# Patient Record
Sex: Female | Born: 1960 | Race: White | Hispanic: No | Smoking: Former smoker
Health system: Southern US, Community
[De-identification: ages and names within clinical notes are randomized; demographics above are authoritative.]

## PROBLEM LIST (undated history)

## (undated) DIAGNOSIS — G2581 Restless legs syndrome: Secondary | ICD-10-CM

## (undated) DIAGNOSIS — R112 Nausea with vomiting, unspecified: Secondary | ICD-10-CM

## (undated) DIAGNOSIS — F329 Major depressive disorder, single episode, unspecified: Secondary | ICD-10-CM

## (undated) DIAGNOSIS — R519 Headache, unspecified: Secondary | ICD-10-CM

## (undated) DIAGNOSIS — R51 Headache: Secondary | ICD-10-CM

## (undated) DIAGNOSIS — I10 Essential (primary) hypertension: Secondary | ICD-10-CM

## (undated) DIAGNOSIS — F32A Depression, unspecified: Secondary | ICD-10-CM

## (undated) DIAGNOSIS — G4733 Obstructive sleep apnea (adult) (pediatric): Secondary | ICD-10-CM

## (undated) DIAGNOSIS — Z9889 Other specified postprocedural states: Secondary | ICD-10-CM

## (undated) DIAGNOSIS — F419 Anxiety disorder, unspecified: Secondary | ICD-10-CM

## (undated) DIAGNOSIS — N6019 Diffuse cystic mastopathy of unspecified breast: Secondary | ICD-10-CM

## (undated) DIAGNOSIS — M1990 Unspecified osteoarthritis, unspecified site: Secondary | ICD-10-CM

## (undated) HISTORY — DX: Essential (primary) hypertension: I10

## (undated) HISTORY — PX: OTHER SURGICAL HISTORY: SHX169

## (undated) HISTORY — DX: Obstructive sleep apnea (adult) (pediatric): G47.33

## (undated) HISTORY — DX: Diffuse cystic mastopathy of unspecified breast: N60.19

## (undated) HISTORY — PX: TOTAL ABDOMINAL HYSTERECTOMY: SHX209

## (undated) HISTORY — DX: Major depressive disorder, single episode, unspecified: F32.9

## (undated) HISTORY — DX: Restless legs syndrome: G25.81

## (undated) HISTORY — DX: Depression, unspecified: F32.A

## (undated) HISTORY — PX: SHOULDER SURGERY: SHX246

---

## 1998-06-14 ENCOUNTER — Other Ambulatory Visit: Admission: RE | Admit: 1998-06-14 | Discharge: 1998-06-14 | Payer: Self-pay | Admitting: Family Medicine

## 1999-08-27 ENCOUNTER — Other Ambulatory Visit: Admission: RE | Admit: 1999-08-27 | Discharge: 1999-08-27 | Payer: Self-pay | Admitting: Family Medicine

## 2001-03-26 ENCOUNTER — Other Ambulatory Visit: Admission: RE | Admit: 2001-03-26 | Discharge: 2001-03-26 | Payer: Self-pay | Admitting: Family Medicine

## 2003-12-06 ENCOUNTER — Ambulatory Visit: Payer: Self-pay | Admitting: Family Medicine

## 2003-12-21 ENCOUNTER — Ambulatory Visit: Payer: Self-pay | Admitting: Family Medicine

## 2004-06-26 ENCOUNTER — Ambulatory Visit: Payer: Self-pay | Admitting: Family Medicine

## 2004-08-06 ENCOUNTER — Ambulatory Visit: Payer: Self-pay | Admitting: Family Medicine

## 2004-09-19 ENCOUNTER — Ambulatory Visit: Payer: Self-pay | Admitting: Family Medicine

## 2004-09-24 ENCOUNTER — Ambulatory Visit: Payer: Self-pay | Admitting: Family Medicine

## 2004-10-24 ENCOUNTER — Ambulatory Visit: Payer: Self-pay | Admitting: Family Medicine

## 2004-11-13 ENCOUNTER — Ambulatory Visit: Payer: Self-pay | Admitting: Family Medicine

## 2005-01-15 ENCOUNTER — Encounter: Payer: Self-pay | Admitting: Pulmonary Disease

## 2005-01-16 ENCOUNTER — Ambulatory Visit: Payer: Self-pay | Admitting: Family Medicine

## 2005-03-15 ENCOUNTER — Ambulatory Visit: Payer: Self-pay | Admitting: Family Medicine

## 2005-03-22 ENCOUNTER — Encounter: Payer: Self-pay | Admitting: Pulmonary Disease

## 2009-12-22 ENCOUNTER — Ambulatory Visit: Payer: Self-pay | Admitting: Pulmonary Disease

## 2009-12-22 DIAGNOSIS — G4733 Obstructive sleep apnea (adult) (pediatric): Secondary | ICD-10-CM | POA: Insufficient documentation

## 2009-12-22 DIAGNOSIS — F329 Major depressive disorder, single episode, unspecified: Secondary | ICD-10-CM

## 2009-12-22 DIAGNOSIS — N6019 Diffuse cystic mastopathy of unspecified breast: Secondary | ICD-10-CM

## 2009-12-22 DIAGNOSIS — G2581 Restless legs syndrome: Secondary | ICD-10-CM | POA: Insufficient documentation

## 2009-12-22 DIAGNOSIS — G4730 Sleep apnea, unspecified: Secondary | ICD-10-CM | POA: Insufficient documentation

## 2009-12-22 DIAGNOSIS — F32A Depression, unspecified: Secondary | ICD-10-CM | POA: Insufficient documentation

## 2009-12-22 DIAGNOSIS — I10 Essential (primary) hypertension: Secondary | ICD-10-CM | POA: Insufficient documentation

## 2010-01-05 ENCOUNTER — Encounter: Payer: Self-pay | Admitting: Pulmonary Disease

## 2010-01-11 ENCOUNTER — Telehealth: Payer: Self-pay | Admitting: Pulmonary Disease

## 2010-02-09 ENCOUNTER — Ambulatory Visit
Admission: RE | Admit: 2010-02-09 | Discharge: 2010-02-09 | Payer: Self-pay | Source: Home / Self Care | Attending: Pulmonary Disease | Admitting: Pulmonary Disease

## 2010-03-06 NOTE — Assessment & Plan Note (Signed)
Summary: consult for osa, RLS   Visit Type:  Initial Consult Copy to:  Orson Gear MD Primary Provider/Referring Provider:  Orson Gear MD  CC:  Sleep Consult. Pt here for a new sleep doctor. pt has rls and bothers her during the night. pt currently on cpap. Marland Kitchen  History of Present Illness: The pt is a 50y/o female who I have been asked to see for management of osa.   She was diagnosed in 2006 with mild osa, with AHI of 11/hr, as well as moderate numbers of leg jerks.  She then underwent a cpap titration study, which determined her optimal cpap pressure to be 7cm.  She later had a repeat cpap titration study in 2010 which showed optimal pressure to be 12cm.  The pt currently is using cpap with nasal pillows and heated humidity.  She denies any significant mouth opening.  She does have issues with some nasal congestion, but is on astelin for this.  She believes the cpap is helping.  She goes to bed around 11pm, and arises at 5am to start her day.  She feels fairly rested upon awakening.  She feels her alertness during the day is adequate, and denies any sleepiness with watching tv or movies.  The other issue she is having is with her RLS.  She is having significant symptoms in the evening as well as kicking, and has not responded well to klonopin or dopamine agonists (per her history).  She tells me that she has had her iron panel checked, and it is "just below nl".  She has been treated with iron supplements without change.  The pt tells me her weight is down 15 pounds over the last 2 years, and her epworth today is only 4.  Preventive Screening-Counseling & Management  Alcohol-Tobacco     Smoking Status: current  Current Medications (verified): 1)  Allegra-D Allergy & Congestion 60-120 Mg Xr12h-Tab (Fexofenadine-Pseudoephedrine) .... Once Daily 2)  Zoloft 100 Mg Tabs (Sertraline Hcl) .... Once Daily 3)  Vitamin D (Ergocalciferol) 50000 Unit Caps (Ergocalciferol) .... Once A Week 4)   Lasix 20 Mg Tabs (Furosemide) .... Once Daily 5)  Clonazepam 1 Mg Tabs (Clonazepam) .... 1/2 Once Daily  Allergies (verified): 1)  ! Levaquin 2)  ! Sulfa 3)  ! Prednisone 4)  ! Benadryl 5)  ! * Immodium  Past History:  Past Medical History:  HYPERTENSION (ICD-401.9) RESTLESS LEGS SYNDROME (ICD-333.94)--iron panel ok FIBROCYSTIC BREAST DISEASE (ICD-610.1) SLEEP APNEA (ICD-780.57)--ahi 11/hr in 2006 DEPRESSION (ICD-311)    Past Surgical History: right shoulder surgery hysterectomy removed cyst on hand x 2 scar tissue removed off right foot  Family History: Reviewed history and no changes required. emphysema--father allergies:sister heart disease--mother, sister cancer--father (not sure)  Social History: Reviewed history and no changes required. 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 1 ppd. started age 26 Smoking Status:  current  Vital Signs:  Patient profile:   50 year old female Height:      64 inches Weight:      190.25 pounds BMI:     32.77 O2 Sat:      99 % on Room air Temp:     98.5 degrees F oral Pulse rate:   88 / minute BP sitting:   160 / 84  (left arm) Cuff size:   large  Vitals Entered By: Carver Fila (December 22, 2009 10:53 AM)  O2 Flow:  Room air CC: Sleep Consult. Pt here for a new sleep  doctor. pt has rls and bothers her during the night. pt currently on cpap.  Comments meds and allergies updated Phone number updated Carver Fila  December 22, 2009 10:53 AM    Physical Exam  General:  ow female in nad Eyes:  PERRLA and EOMI.   Nose:  deviated septum to right with obstruction currently left patent Mouth:  normal uvula and palate, mildly prominent tonsils, small oropharynx Neck:  no jvd, tmg, LN Lungs:  clear to auscultation Heart:  rrr, no mrg Abdomen:  soft and nontender, bs+ Extremities:  mild edema, no cyanosis  pulses intact distally Neurologic:  alert and oriented, moves all 4.   Impression &  Recommendations:  Problem # 1:  OBSTRUCTIVE SLEEP APNEA (ICD-327.23) the pt had mild osa from npsg in 2006, but her weight is up about 20 pounds from that time period.  She has abnormal upper airway anatomy, and may be a candidate for dental appliance as well.  She is having a lot of nasal congestion which interferes with using cpap.  Given the mild degree of her osa, and her nasal congestion/obstruction issues, she may benefit from an ENT evaluation.  I have discussed this with the pt, and she agrees.  She will continue on cpap for now until she sees ENT.  I have encouraged her to work aggressively on weight loss.  Problem # 2:  RESTLESS LEGS SYNDROME (ICD-333.94) the pt has symptoms c/w RLS, and has documented kicks on her studies with sleep disruption.  She feels that she has not responded well to dopamine agonists,and continues to be symptomatic on klonopin.  I am hesitant to increase her klonopin dose with her sleep apnea (can worsen).  Will try her on horizant and see how she responds.  Medications Added to Medication List This Visit: 1)  Allegra-d Allergy & Congestion 60-120 Mg Xr12h-tab (Fexofenadine-pseudoephedrine) .... Once daily 2)  Zoloft 100 Mg Tabs (Sertraline hcl) .... Once daily 3)  Vitamin D (ergocalciferol) 50000 Unit Caps (Ergocalciferol) .... Once a week 4)  Lasix 20 Mg Tabs (Furosemide) .... Once daily 5)  Clonazepam 1 Mg Tabs (Clonazepam) .... 1/2 once daily  Other Orders: Consultation Level V [phone]) ENT Referral (ENT)  Patient Instructions: 1)  trial of horizant 600mg  one after dinner each night.  Give me some feedback in few weeks how things are going. 2)  continue with same cpap machine for now, but will refer you to ENT for upper airway evaluation.  Will also get you info on dental appliance to consider.  Do some research on internet. 3)  work on weight loss. 4)  If you decide to stay on cpap for now, will get you a new machine and see you in 6mos.

## 2010-03-06 NOTE — Progress Notes (Signed)
Summary: feedback re: new meds  Phone Note Call from Patient   Caller: Patient Call For: clance Summary of Call: pt was seen by kc 11/18. is calling to leave info re: meds w/ nurse. [phone] Initial call taken by: Tivis Ringer, CNA,  January 11, 2010 4:29 PM  Follow-up for Phone Call        called and spoke with pt --she stated that she has been taking the horizant 600mg    and she stated that she has finally started to sleep but her legs are still a little jumpy but she feels this is from her coming off of the clonazepam.  she is out of this med now.  she was told to call back and give [facility] feedback on the med.  please advise. thanks Randell Loop CMA  January 11, 2010 4:48 PM   Additional Follow-up for Phone Call Additional follow up Details #1::        if she thinks this is helping, I think we should continue a longer trial and see how she does. would call in horizant 600mg  one each night...#30, 6 fills.  have her f/u with me in 4 weeks. Additional Follow-up by: Barbaraann Share MD,  January 11, 2010 5:18 PM    Additional Follow-up for Phone Call Additional follow up Details #2::    called and spoke with pt scheduled appt 1-6 at 3:45 and pt is aware to cont with the same meds per [facility] Randell Loop CMA  January 11, 2010 5:32 PM   New/Updated Medications: HORIZANT 600 MG XR24H-TAB (GABAPENTIN ENACARBIL) take one tablet by mouth at bedtime Prescriptions: HORIZANT 600 MG XR24H-TAB (GABAPENTIN ENACARBIL) take one tablet by mouth at bedtime  #30 x 6   Entered by:   Randell Loop CMA   Authorized by:   Barbaraann Share MD   Signed by:   Randell Loop CMA on 01/11/2010   Method used:   Electronically to        [facility]* (retail)       [address] [place], [place]  [address]       Ph: [phone] or [phone]       Fax: [phone]   RxID:   [id number]

## 2010-03-08 NOTE — Assessment & Plan Note (Addendum)
Summary: rov for osa and rls.   Copy to:  Orson Gear MD Primary Provider/Referring Provider:  Orson Gear MD  CC:  follow up. Pt states she uses her cpap everynight x 6-7 hrs a night. Pt states she is having no problems with her machine. Pt took herself off the horizant due to it gave her bad indigestion. Marland Kitchen  History of Present Illness: the pt comes in today for f/u of her known mild osa and RLS.  At the last visit, she was referred to ENT for consideration of surgery for her osa.  She has been seen, and has decided to proceed with the surgery in the next few weeks.  She will wear the cpap in the interim.  She was also started on horizant at the last visit for her RLS.  She did not like the way it made her feel, and gave her "indigestion".  She has discontinued all meds at this point.  Current Medications (verified): 1)  Allegra-D Allergy & Congestion 60-120 Mg Xr12h-Tab (Fexofenadine-Pseudoephedrine) .... Once Daily 2)  Zoloft 100 Mg Tabs (Sertraline Hcl) .... Once Daily 3)  Vitamin D (Ergocalciferol) 50000 Unit Caps (Ergocalciferol) .... Once A Week 4)  Lasix 20 Mg Tabs (Furosemide) .... Once Daily 5)  Astepro 0.15 % Soln (Azelastine Hcl) .Marland Kitchen.. 1 Sprays Each Nostrile At Night  Allergies (verified): 1)  ! Levaquin 2)  ! Sulfa 3)  ! Prednisone 4)  ! Benadryl 5)  ! * Immodium  Review of Systems       The patient complains of sore throat, nasal congestion/difficulty breathing through nose, and hand/feet swelling.  The patient denies shortness of breath with activity, shortness of breath at rest, productive cough, non-productive cough, coughing up blood, chest pain, irregular heartbeats, acid heartburn, indigestion, loss of appetite, weight change, abdominal pain, difficulty swallowing, tooth/dental problems, headaches, sneezing, itching, ear ache, anxiety, depression, joint stiffness or pain, rash, change in color of mucus, and fever.    Vital Signs:  Patient profile:   50 year  old female Height:      64 inches Weight:      195.25 pounds BMI:     33.64 O2 Sat:      99 % on Room air Temp:     97.6 degrees F oral Pulse rate:   83 / minute BP sitting:   122 / 80  (left arm) Cuff size:   large  Vitals Entered By: Carver Fila (February 09, 2010 3:50 PM)  O2 Flow:  Room air CC: follow up. Pt states she uses her cpap everynight x 6-7 hrs a night. Pt states she is having no problems with her machine. Pt took herself off the horizant due to it gave her bad indigestion.  Comments meds and allergies updated Phone number updated Carver Fila  February 09, 2010 3:51 PM    Physical Exam  General:  ow female in nad Nose:  no skin breakdown or pressure necrosis from cpap mask Extremities:  no edema or cyanosis  Neurologic:  alert and oriented, moves all 4.   Impression & Recommendations:  Problem # 1:  OBSTRUCTIVE SLEEP APNEA (ICD-327.23) the pt is currently wearing cpap compliantly, and has responded well.  However, she wishes to have surgery for her osa, and will get back with ENT.  She has asked me to fill out paperwork for DMV/DOT, but I will need to have a download off her machine for documentation of compliance.  I have also encouraged her  to work on weight loss.  Problem # 2:  RESTLESS LEGS SYNDROME (ICD-333.94) the pt has not responded well or has had intolerance to many meds.  I have offered to try her on a higher dose of klonopine, but she wishes to stay off all meds at this point, and see how she does.  Medications Added to Medication List This Visit: 1)  Astepro 0.15 % Soln (Azelastine hcl) .Marland Kitchen.. 1 sprays each nostrile at night  Other Orders: DME Referral (DME) Est. Patient Level III ([address])  Patient Instructions: 1)  will get download from your dme to fill out your paperwork 2)  please call me once you have your nasal surgery, so we can re-evaluate whether you still have sleep apnea 3)  work on weight loss  Appended Document: rov for osa and  rls. never received this pt's 6mos download from dme to fill out her paperwork....can we get this.  see orders from last pt visit.

## 2010-03-14 NOTE — Consult Note (Signed)
Summary: Osborn Coho MD/[facility]  Osborn Coho MD/[facility]   Imported By: Lester Northumberland 03/08/2010 10:26:22  _____________________________________________________________________  External Attachment:    Type:   Image     Comment:   External Document

## 2010-03-26 ENCOUNTER — Encounter: Payer: Self-pay | Admitting: Pulmonary Disease

## 2010-04-03 ENCOUNTER — Telehealth (INDEPENDENT_AMBULATORY_CARE_PROVIDER_SITE_OTHER): Payer: Self-pay | Admitting: *Deleted

## 2010-04-03 NOTE — Miscellaneous (Signed)
Summary: auto download report for last 6mos  Clinical Lists Changes  download shows optimal pressure of 11cm, borderline leak, and great compliance at 97% greater than or equal to 4 hrs.

## 2010-04-13 ENCOUNTER — Encounter: Payer: Self-pay | Admitting: Pulmonary Disease

## 2010-04-17 NOTE — Letter (Signed)
Summary: [facility]  [address]   [place], [place] [address]   Phone: [phone]  Fax: [phone]    04/13/2010  Darlisa Mcniel [address] [place], [place]  [address]  Dear Ms. Sedalia Muta,   We have attempted to contact you by phone several times but have been unable to reach you.  Please call our office at your earliest convenience so that we may discuss information regarding paperwork that you want Dr. Shelle Iron to fill out for you.        Sincerely,   Marcelyn Bruins, M.D.

## 2010-04-17 NOTE — Progress Notes (Signed)
Summary: DOT paperwork  LMTCBx3---send letter  Phone Note Outgoing Call Call back at [facility] Phone [phone]   Call placed by: Arman Filter LPN,  April 03, 2010 8:47 AM Call placed to: Patient Summary of Call: we have pt's DOT paperwork but the paperwork is all information that needs to be filled out by her PCP.  There is a specific form related to OSA that she needs to get for [place] and drop off for [facility] to fill out.    [id number].  Aundra Millet Yetzali Weld LPN  April 03, 2010 8:50 AM   [facility].  Aundra Millet Blonnie Maske LPN  April 05, 1476 4:57 PM   [facility].  Arman Filter LPN  April 11, 2954 10:35 AM   [facility].  I have attempted to contact pt multiple times.  Will send pt a letter to call our office to inform her of the above information.   Initial call taken by: Arman Filter LPN,  April 13, 2010 11:19 AM

## 2011-03-12 ENCOUNTER — Encounter: Payer: Self-pay | Admitting: Pulmonary Disease

## 2011-03-13 ENCOUNTER — Encounter: Payer: Self-pay | Admitting: Pulmonary Disease

## 2011-03-13 ENCOUNTER — Encounter: Payer: Self-pay | Admitting: *Deleted

## 2011-03-13 ENCOUNTER — Ambulatory Visit (INDEPENDENT_AMBULATORY_CARE_PROVIDER_SITE_OTHER): Payer: Self-pay | Admitting: Pulmonary Disease

## 2011-03-13 DIAGNOSIS — G2581 Restless legs syndrome: Secondary | ICD-10-CM

## 2011-03-13 DIAGNOSIS — G4733 Obstructive sleep apnea (adult) (pediatric): Secondary | ICD-10-CM

## 2011-03-13 MED ORDER — CLONAZEPAM 0.5 MG PO TABS: ORAL_TABLET | ORAL | Status: DC

## 2011-03-13 NOTE — Assessment & Plan Note (Signed)
The patient has been doing very well with CPAP, and feels that she sleeps well with adequate daytime alertness.  She is due for a new machine and supplies, but currently is without insurance for another month or 2.  She will contact us when that has been restarted, and we can send an order for a new device and supplies.  I have also encouraged her to work aggressively on weight loss.  She will decide about nasal surgery some time this year.

## 2011-03-13 NOTE — Assessment & Plan Note (Signed)
Will restart the patient on Klonopin at low to moderate doses only and see if she has improvement in her symptoms.

## 2011-03-13 NOTE — Progress Notes (Signed)
  Subjective:    Patient ID: Kristina Glover, female    DOB: 1960/10/22, 51 y.o.   MRN: [id number]  HPI The patient comes in today for followup of her known mild obstructive sleep apnea, and also restless leg syndrome.  She is wearing CPAP compliantly, and her download verifies this as well as showing good control of her sleep apnea.  She is having no issues with her device, but it is due for replacement.  She also needs a new mask which is breaking down.  She feels that she is sleeping well with the device, and denies any daytime sleepiness.  She denies any sleepiness with driving.  The patient currently is on no medication for her restless leg syndrome, and is beginning to have more issues with this.  She has not responded to dopamine agonists in the past, and was intolerant of gabapentin.  Klonopin has worked for her in the past but not completely resolve her symptoms.  We had been trying to avoid high doses due to its addiction potential and sedative effect.   Review of Systems  Constitutional: Negative for fever and unexpected weight change.  HENT: Positive for congestion, sore throat, rhinorrhea, sneezing, postnasal drip and sinus pressure. Negative for ear pain, nosebleeds, trouble swallowing and dental problem.   Eyes: Negative for redness and itching.  Respiratory: Negative for cough, chest tightness, shortness of breath and wheezing.   Cardiovascular: Negative for palpitations and leg swelling.  Gastrointestinal: Negative for nausea and vomiting.  Genitourinary: Negative for dysuria.  Musculoskeletal: Negative for joint swelling.  Skin: Negative for rash.  Neurological: Positive for headaches.  Hematological: Does not bruise/bleed easily.  Psychiatric/Behavioral: Positive for dysphoric mood. The patient is nervous/anxious.        Objective:   Physical Exam Overweight female in no acute distress Nose without purulent discharge noted No skin breakdown or pressure necrosis from the  CPAP mask Lower extremities with no edema, no cyanosis Alert and oriented, moves all 4 extremities.  Does not appear to be overly sleepy.       Assessment & Plan:

## 2011-03-13 NOTE — Patient Instructions (Signed)
Will start back on klonopine 0.5mg  1-2 after dinner as needed for restless leg symptoms Continue with cpap, and let us know when your insurance is restarted.  We can arrange for new machine and supplies at that time. Work on weight loss.  Would be happy to refer you to nutrition at [facility] when ready. followup with me in one year.

## 2011-03-18 ENCOUNTER — Telehealth: Payer: Self-pay | Admitting: Pulmonary Disease

## 2011-03-20 NOTE — Telephone Encounter (Signed)
The right form is not included in the paperwork.  There is a specific form for patients with sleep disorders.

## 2011-03-20 NOTE — Telephone Encounter (Signed)
Paperwork put in [facility] VIP folder for him to review.

## 2011-03-20 NOTE — Telephone Encounter (Signed)
I spoke with pt and made her aware that the form is missing a page for sleep apnea specifically. Pt is going to call the department of transportation to see if she can get that sent to her. Will sign off message

## 2011-03-22 ENCOUNTER — Telehealth: Payer: Self-pay | Admitting: Pulmonary Disease

## 2011-03-22 NOTE — Telephone Encounter (Signed)
I have received form and advised pt of this. She stated we need to mail his to the [facility] address on the cover of form. I have made a copy of the form and was mailed to the [facility]. Nothing further needed

## 2011-03-22 NOTE — Telephone Encounter (Signed)
Done and sent to triage 

## 2011-03-22 NOTE — Telephone Encounter (Signed)
I spoke with pt and she stated she spoke with the [facility] and was advised she needed to fill out on the bottom page of 5 under other impairments. I have placed this in [facility] VIP folder. Please advise Dr. Shelle Iron thanks

## 2011-04-23 ENCOUNTER — Telehealth: Payer: Self-pay | Admitting: Pulmonary Disease

## 2011-04-23 NOTE — Telephone Encounter (Signed)
I spoke with pt and she stated she needed this mailed to the [facility] that was on the front of the paperwork. I have put this in the mail and made a copy of the form. Nothing further was needed

## 2011-04-23 NOTE — Telephone Encounter (Signed)
Paperwork put in [facility] VIP folder for him to review and fill out.

## 2011-04-23 NOTE — Telephone Encounter (Signed)
Sent to triage

## 2011-04-23 NOTE — Telephone Encounter (Signed)
Mail papers to [facility] and call pt when sent.

## 2011-08-30 ENCOUNTER — Telehealth: Payer: Self-pay | Admitting: Pulmonary Disease

## 2011-08-30 NOTE — Telephone Encounter (Signed)
Pt is requesting a new cpap machine. She says hers is 47+ years old. Also needs a new DME company because her old one is no longer in business. She says her pressure setting is 12cm. She is aware KC is out of the office until Tues., 09/03/11 and we will call her back once we speak with him. Pt verbalized understanding. Pls advise.

## 2011-09-03 ENCOUNTER — Other Ambulatory Visit: Payer: Self-pay | Admitting: Pulmonary Disease

## 2011-09-03 DIAGNOSIS — G4733 Obstructive sleep apnea (adult) (pediatric): Secondary | ICD-10-CM

## 2011-09-03 NOTE — Telephone Encounter (Signed)
Please let pt know that order has been sent to [facility], and they will arrange new dme for her.

## 2011-09-04 NOTE — Telephone Encounter (Signed)
Pt aware and needed nothing further 

## 2011-09-05 ENCOUNTER — Telehealth: Payer: Self-pay | Admitting: Pulmonary Disease

## 2011-09-05 DIAGNOSIS — G4733 Obstructive sleep apnea (adult) (pediatric): Secondary | ICD-10-CM

## 2011-09-05 NOTE — Telephone Encounter (Signed)
Order sent to [facility] for new CPAP stating that current machine not working.

## 2011-10-08 ENCOUNTER — Telehealth: Payer: Self-pay | Admitting: Pulmonary Disease

## 2011-10-08 DIAGNOSIS — G4733 Obstructive sleep apnea (adult) (pediatric): Secondary | ICD-10-CM

## 2011-10-08 NOTE — Telephone Encounter (Signed)
I spoke with the pt and she states she has insurance now and wants an order sent to [facility] for new cpap machine, mask and supplies. According to Dr. Shelle Iron last OV note, instruct was as follows: Continue with cpap, and let us know when your insurance is restarted. We can arrange for new machine and supplies at that time. I will place order.Carron Curie, CMA

## 2011-11-03 ENCOUNTER — Emergency Department (HOSPITAL_COMMUNITY): Payer: BC Managed Care – PPO

## 2011-11-03 ENCOUNTER — Encounter (HOSPITAL_COMMUNITY): Payer: Self-pay | Admitting: *Deleted

## 2011-11-03 ENCOUNTER — Observation Stay (HOSPITAL_COMMUNITY)
Admission: EM | Admit: 2011-11-03 | Discharge: 2011-11-04 | Disposition: A | Discharge status: Home / Self Care | Payer: BC Managed Care – PPO | Attending: Emergency Medicine | Admitting: Emergency Medicine

## 2011-11-03 DIAGNOSIS — G2581 Restless legs syndrome: Secondary | ICD-10-CM | POA: Insufficient documentation

## 2011-11-03 DIAGNOSIS — G4733 Obstructive sleep apnea (adult) (pediatric): Secondary | ICD-10-CM | POA: Insufficient documentation

## 2011-11-03 DIAGNOSIS — F3289 Other specified depressive episodes: Secondary | ICD-10-CM | POA: Insufficient documentation

## 2011-11-03 DIAGNOSIS — R0602 Shortness of breath: Secondary | ICD-10-CM | POA: Insufficient documentation

## 2011-11-03 DIAGNOSIS — F329 Major depressive disorder, single episode, unspecified: Secondary | ICD-10-CM | POA: Insufficient documentation

## 2011-11-03 DIAGNOSIS — J329 Chronic sinusitis, unspecified: Secondary | ICD-10-CM | POA: Insufficient documentation

## 2011-11-03 DIAGNOSIS — I10 Essential (primary) hypertension: Secondary | ICD-10-CM | POA: Insufficient documentation

## 2011-11-03 DIAGNOSIS — R079 Chest pain, unspecified: Principal | ICD-10-CM | POA: Insufficient documentation

## 2011-11-03 LAB — CBC WITH DIFFERENTIAL/PLATELET
Basophils Relative: 1 % (ref 0–1)
HCT: 44.2 % (ref 36.0–46.0)
Hemoglobin: 15.4 g/dL — ABNORMAL HIGH (ref 12.0–15.0)
Lymphocytes Relative: 31 % (ref 12–46)
MCHC: 34.8 g/dL (ref 30.0–36.0)
Monocytes Absolute: 0.8 10*3/uL (ref 0.1–1.0)
Monocytes Relative: 9 % (ref 3–12)
Neutro Abs: 5.5 10*3/uL (ref 1.7–7.7)
Neutrophils Relative %: 58 % (ref 43–77)
RBC: 5.01 MIL/uL (ref 3.87–5.11)
WBC: 9.4 10*3/uL (ref 4.0–10.5)

## 2011-11-03 LAB — POCT I-STAT TROPONIN I: Troponin i, poc: 0 ng/mL (ref 0.00–0.08)

## 2011-11-03 LAB — COMPREHENSIVE METABOLIC PANEL
ALT: 22 U/L (ref 0–35)
BUN: 8 mg/dL (ref 6–23)
CO2: 23 mEq/L (ref 19–32)
Calcium: 9.7 mg/dL (ref 8.4–10.5)
Creatinine, Ser: 0.64 mg/dL (ref 0.50–1.10)
GFR calc Af Amer: >90 mL/min (ref >90)
GFR calc non Af Amer: >90 mL/min (ref >90)
Glucose, Bld: 100 mg/dL — ABNORMAL HIGH (ref 70–99)
Sodium: 140 mEq/L (ref 135–145)
Total Protein: 7.6 g/dL (ref 6.0–8.3)

## 2011-11-03 MED ORDER — MECLIZINE HCL 25 MG PO TABS
12.5000 mg | ORAL_TABLET | Freq: Once | ORAL | Status: AC
  Administered 2011-11-03: 12.5 mg via ORAL
  Filled 2011-11-03: qty 1

## 2011-11-03 NOTE — ED Notes (Signed)
IV NURSE AT BEDSIDE ATTEMPTING TO START IV ACCESS FOR CT SCAN IN AM.

## 2011-11-03 NOTE — ED Notes (Signed)
Pt. Given coke and crackers.  Dinner tray ordered

## 2011-11-03 NOTE — ED Provider Notes (Signed)
3:44 PM Assumed care of patient in the CDU from Dr. Anitra Lauth.  Patient is on Chest Pain Protocol.  Plan is for the patient to have a Coronary CT.  Patient comes in today with a chief complaint of chest pain with onset today.  Chest pain associated with SOB.  She received one NG and one ASA prior to arrival in the ED.  Family history significant for both sister and mother had a CABG in their 42's.  EKG with no acute findings.  Initial troponin is negative.  CXR with no acute findings.  Reassessed patient.  She reports that she is not having any chest pain at this time.  Patient alert and orientated x 3 Heart:  RRR Lungs:  CTAB Abdomen:  Soft and nontender Extremities:  DP pulse 2+ bilaterally, no edema  7:00 PM Reassessed patient.  Patient in no acute distress.  She denies chest pain at this time.  11:45 PM  Reassessed patient.  No acute distress.  She denies chest pain at this time. Patient alert and orientated x 3 Heart:  RRR Lungs:  CTAB Abdomen:  Soft and nontender Extremities:  DP pulse 2+ bilaterally, no edema  12:00 AM Patient signed out to Dr. Bebe Shaggy at shift change.  He assumes care of patient overnight.  Pascal Lux [place], PA-C 11/04/11 1227

## 2011-11-03 NOTE — ED Notes (Signed)
Pt ambulated to bathroom with assistance, pt refused wheelchair, refused to get vital signs or EKG prior to going to bathroom, pt ambulating without difficulty, denies pain, no distress noted.

## 2011-11-03 NOTE — ED Notes (Signed)
Received report from Bobby, RN and now resuming care of patient.  

## 2011-11-03 NOTE — ED Provider Notes (Addendum)
History     CSN: [id number]  Arrival date & time 11/03/11  1223   First MD Initiated Contact with Patient 11/03/11 1245      Chief Complaint  Patient presents with  . Chest Pain    (Consider location/radiation/quality/duration/timing/severity/associated sxs/prior treatment) HPI Comments: Patient with sinus symptoms for the last few weeks. Recently saw her Dr. on Friday and was started on amoxicillin and Tessalon Perles for persistent sinus pain, congestion and nonproductive cough  Patient is a 51 y.o. female presenting with chest pain. The history is provided by the patient.  Chest Pain The chest pain began 3 - 5 hours ago. Duration of episode(s) is 2 minutes. Chest pain occurs intermittently. The chest pain is resolved. Associated with: nothing.  started while in the shower today. At its most intense, the pain is at 3/10. The pain is currently at 0/10. The severity of the pain is mild. The quality of the pain is described as aching and pressure-like. The pain does not radiate. Primary symptoms include shortness of breath, cough and dizziness. Pertinent negatives for primary symptoms include no fever, no palpitations, no nausea and no vomiting.  She describes the dizziness as a sensation of spinning. The dizziness began yesterday. The dizziness has been gradually worsening since its onset. It is a new problem. Associated with: recently started new medication for sinus infection. Dizziness does not occur with nausea, vomiting or diaphoresis.   Pertinent negatives for associated symptoms include no diaphoresis and no near-syncope. She tried nitroglycerin (helped her elevated blood pressure) for the symptoms.  Her past medical history is significant for hypertension.  Pertinent negatives for past medical history include no CAD and no diabetes.  Her family medical history is significant for CAD in family. Family history comments: mother and sister with CABG in the early 50's     Past Medical  History  Diagnosis Date  . Hypertension   . RLS (restless legs syndrome)   . Fibrocystic breast disease   . OSA (obstructive sleep apnea)   . Depression     Past Surgical History  Procedure Date  . Shoulder surgery     right  . Total abdominal hysterectomy   . Cyst removed     on hand x 2  . Scar tissue removed     right foot    Family History  Problem Relation Age of Onset  . Emphysema Father   . Allergies Sister   . Heart disease Mother   . Heart disease Sister   . Cancer Father     unsure what kind    History  Substance Use Topics  . Smoking status: Current Every Day Smoker -- 1.0 packs/day for 35 years    Types: Cigarettes  . Smokeless tobacco: Not on file  . Alcohol Use: Not on file    OB History    Grav Para Term Preterm Abortions TAB SAB Ect Mult Living                  Review of Systems  Constitutional: Negative for fever and diaphoresis.  HENT: Positive for congestion and sinus pressure. Negative for sore throat and voice change.   Respiratory: Positive for cough and shortness of breath.   Cardiovascular: Positive for chest pain. Negative for palpitations and near-syncope.  Gastrointestinal: Negative for nausea and vomiting.  Neurological: Positive for dizziness.  All other systems reviewed and are negative.    Allergies  Diphenhydramine hcl; Levofloxacin; Prednisone; and Sulfonamide derivatives  Home Medications  Current Outpatient Rx  Name Route Sig Dispense Refill  . AZELASTINE HCL 137 MCG/SPRAY NA SOLN Nasal Place 1 spray into the nose daily. Use in each nostril as directed    . CLONAZEPAM 0.5 MG PO TABS  Take 1 to 2 tabs after dinner as needed. 60 tablet 2  . FEXOFENADINE-PSEUDOEPHED ER 60-120 MG PO TB12 Oral Take 1 tablet by mouth daily.    . FUROSEMIDE 20 MG PO TABS Oral Take 20 mg by mouth daily.    Marland Kitchen COZAAR PO Oral Take 1 tablet by mouth daily.    . SERTRALINE HCL 100 MG PO TABS Oral Take 100 mg by mouth daily.      BP 140/82   Pulse 89  Temp 98.6 F (37 C) (Oral)  Resp 18  Ht 5\' 4"  (1.626 m)  Wt 200 lb (90.719 kg)  BMI 34.33 kg/m2  SpO2 99%  Physical Exam  Nursing note and vitals reviewed. Constitutional: She is oriented to person, place, and time. She appears well-developed and well-nourished. No distress.  HENT:  Head: Normocephalic and atraumatic.  Right Ear: Tympanic membrane and ear canal normal.  Left Ear: Tympanic membrane and ear canal normal.  Nose: Mucosal edema and rhinorrhea present.  Mouth/Throat: Oropharynx is clear and moist.  Eyes: Conjunctivae normal and EOM are normal. Pupils are equal, round, and reactive to light.  Neck: Normal range of motion. Neck supple.  Cardiovascular: Normal rate, regular rhythm and intact distal pulses.   No murmur heard. Pulmonary/Chest: Effort normal and breath sounds normal. No respiratory distress. She has no wheezes. She has no rales. She exhibits no tenderness.  Abdominal: Soft. She exhibits no distension. There is no tenderness. There is no rebound and no guarding.  Musculoskeletal: Normal range of motion. She exhibits no edema and no tenderness.  Neurological: She is alert and oriented to person, place, and time.  Skin: Skin is warm and dry. No rash noted. No erythema.  Psychiatric: She has a normal mood and affect. Her behavior is normal.    ED Course  Procedures (including critical care time)  Labs Reviewed  COMPREHENSIVE METABOLIC PANEL - Abnormal; Notable for the following:    Glucose, Bld 100 (*)     All other components within normal limits  CBC WITH DIFFERENTIAL - Abnormal; Notable for the following:    Hemoglobin 15.4 (*)     All other components within normal limits  POCT I-STAT TROPONIN I   Dg Chest 2 View  11/03/2011  *RADIOLOGY REPORT*  Clinical Data: Chest pain, shortness of breath  CHEST - 2 VIEW  Comparison: 03/20/2006  Findings: Lungs are clear. No pleural effusion or pneumothorax.  Cardiomediastinal silhouette is within normal  limits.  Visualized osseous structures are within normal limits.  IMPRESSION: No evidence of acute cardiopulmonary disease.   Original Report Authenticated By: Charline Bills, M.D.      Date: 11/03/2011  Rate: 82  Rhythm: normal sinus rhythm  QRS Axis: normal  Intervals: normal  ST/T Wave abnormalities: normal  Conduction Disutrbances: none  Narrative Interpretation: unremarkable      1. Chest pain   2. Sinusitis       MDM   Patient with vague symptoms of upper respiratory and sinus symptoms for the last several weeks and saw her doctor on Friday was started on Tessalon Perles and amoxicillin. However she states today while she was in the shower she started to feel short of breath and had intermittent chest pains. She also states she developed  a headache and felt like it was her blood pressure when she went to the fire station she had a blood pressure of 190/98 and continued intermittent chest pain. She was given aspirin and nitroglycerin which did not seem to change how often she got the episodes of chest pain but did improve the blood pressure to 140/80 on arrival here. Will patient's only risk factor for cardiac disease his hypertension but she does have a strong family history of both her sister and mother having a bypass surgery in their 82s. CBC, CMP, troponin, chest x-ray pending. EKG is within normal limits. Patient is a TIMI 0 however does have risk factors. We'll cycle enzymes and recheck. Patient is also complaining of vertigo which she feels is from her sinus disease.  She was given meclizine   2:49 PM Initial labs are within normal limits. Chest x-ray is within normal limits. Patient symptoms are improved after meclizine. Given patient is at low risk chest pain she was given the option to followup with her PCP after a 3 hour troponin versus a low risk chest pain drops protocol and she wants to stay. She was placed on the low risk chest pain protocol for coronary CT. Her  BMI is 34.  She is currently not having chest pain appeared     Gwyneth Sprout, MD 11/03/11 1449  Gwyneth Sprout, MD 11/03/11 1515  Gwyneth Sprout, MD 11/03/11 1516

## 2011-11-03 NOTE — ED Notes (Signed)
Pt in via EMS, per EMS- pt in c/o chest pain and dizziness. Pt states this am while in the shower she developed chest pain that radiated into left shoulder, with nausea, dizziness and shortness of breath. Pt BP upon arrival was 190/98, pt denies chest pain at this time, pain is intermittent, had episode in route and was given nitro SL x1, c/o continued shortness of breath. IV started PTA.  Pt alert and oriented, skin warm and dry.

## 2011-11-04 ENCOUNTER — Observation Stay (HOSPITAL_COMMUNITY): Payer: BC Managed Care – PPO

## 2011-11-04 MED ORDER — FLUTICASONE PROPIONATE 50 MCG/ACT NA SUSP: 2.0000 | Freq: Every day | NASAL | Status: DC

## 2011-11-04 MED ORDER — NITROGLYCERIN 0.4 MG SL SUBL
0.4000 mg | SUBLINGUAL_TABLET | Freq: Once | SUBLINGUAL | Status: AC
  Administered 2011-11-04: 0.4 mg via SUBLINGUAL

## 2011-11-04 MED ORDER — ASPIRIN 81 MG PO CHEW: 81.0000 mg | CHEWABLE_TABLET | Freq: Every day | ORAL | Status: DC

## 2011-11-04 MED ORDER — METOPROLOL TARTRATE 25 MG PO TABS
100.0000 mg | ORAL_TABLET | Freq: Once | ORAL | Status: AC
  Administered 2011-11-04: 100 mg via ORAL
  Filled 2011-11-04: qty 4

## 2011-11-04 MED ORDER — SIMVASTATIN 20 MG PO TABS: 20.0000 mg | ORAL_TABLET | Freq: Every evening | ORAL | Status: DC

## 2011-11-04 MED ORDER — METOPROLOL TARTRATE 1 MG/ML IV SOLN
INTRAVENOUS | Status: AC
  Filled 2011-11-04: qty 10

## 2011-11-04 MED ORDER — IOHEXOL 350 MG/ML SOLN
100.0000 mL | Freq: Once | INTRAVENOUS | Status: AC | PRN
  Administered 2011-11-04: 80 mL via INTRAVENOUS

## 2011-11-04 MED ORDER — AZITHROMYCIN 250 MG PO TABS: 250.0000 mg | ORAL_TABLET | Freq: Every day | ORAL | Status: DC

## 2011-11-04 MED ORDER — METOPROLOL TARTRATE 1 MG/ML IV SOLN
5.0000 mg | Freq: Once | INTRAVENOUS | Status: AC
  Administered 2011-11-04: 5 mg via INTRAVENOUS

## 2011-11-04 NOTE — Progress Notes (Signed)
Utilization review completed.  

## 2011-11-04 NOTE — ED Notes (Signed)
Pt arrived in CT for CTA. Spoke with Dr. Lyman Bishop, obtained verbal order for 5mg  IV metoprolol before procedure.

## 2011-11-04 NOTE — ED Notes (Signed)
Pt returned to exam room. Vital signs stable. Denies chest pain. She is resting quietly at the time. No complications from procedure. Remains on cardiac monitor.

## 2011-11-04 NOTE — ED Provider Notes (Signed)
7:45:  Protocol Metoprolol not given at 6:00 a.m. Discussed with Dr. Lyman Bishop who recommended giving 100 mg PO now and proceed with delayed study. Will reassess in one hour to determine if additional po dose required. Patient continues to be pain-free.  Lung: scattered rhonchi Cardio: RRR, no murmur Abd: soft NT  12:30- CTA resulted and shows moderate artery disease with high risk calcium score of 11.92. Discussed with Dr. Katrinka Blazing who will see patient in CDU. Patient remains pain free.  Rodena Medin, PA-C 11/04/11 1303

## 2011-11-04 NOTE — ED Notes (Signed)
Spoke with Dr. Lyman Bishop, pt to be transported for CTA x41min. Pt updated on plan of care.

## 2011-11-04 NOTE — ED Notes (Addendum)
Pt resting quietly at the time. Remains on cardiac monitor. Denies chest pain. Pt is conscious alert and oriented x4. Vital signs stable. Pt to have CTA this morning, will monitor HR x1 hour before sending for testing.

## 2011-11-04 NOTE — ED Notes (Signed)
Pt transported to CT for CTA. Vital signs stable.

## 2011-11-04 NOTE — ED Notes (Signed)
Patient is resting without distress. Reports being pain free.

## 2011-11-04 NOTE — ED Provider Notes (Signed)
Patient seen and evaluated by Dr. Katrinka Blazing in ED. He has arranged fro outpatient evaluation - nuclear stress test. Will place on simvastatin and ASA.   Rodena Medin, PA-C 11/04/11 1535

## 2011-11-04 NOTE — ED Notes (Signed)
Dr smith here to see pt 

## 2011-11-04 NOTE — ED Provider Notes (Signed)
Medical screening examination/treatment/procedure(s) were performed by non-physician practitioner and as supervising physician I was immediately available for consultation/collaboration.  Ivonne Freeburg, MD 11/04/11 1607 

## 2011-11-04 NOTE — ED Provider Notes (Signed)
Medical screening examination/treatment/procedure(s) were performed by non-physician practitioner and as supervising physician I was immediately available for consultation/collaboration.  Geoffery Lyons, MD 11/04/11 [phone]

## 2011-11-04 NOTE — Consult Note (Signed)
Admit date: 11/03/2011  Referring Physician  [facility] Primary Physician  [facility] Primary Cardiologist  HWBSmith, III, MD Reason for Consultation  chest discomfort  ASSESSMENT: 1. Dizziness, headache, and elevated blood pressure possibly related to sinus infection  2. Several month history of exertional left chest discomfort, with atypical features. Abnormal coronary CTA performed 11/04/2011 with evidence of mild to moderate obstructive disease.  3. History of depression  4. Tobacco use  PLAN: 1. Aggressive risk factor modification including smoking cessation, and initiation of statin therapy  2. Aspirin one per day  3. Stress Cardiolite study to rule out ischemia given the mildly abnormal coronary CTA. My office will call the patient arranged to do the nuclear study this week  4. Assessment and/or therapy for her headache and dizziness of uncertain cause    HPI: The patient is 51 years of age and has a family history of premature coronary atherosclerosis. Starting approximately 5 days ago she began experiencing a full feeling in her head, headache, neck sinus congestion, and dizziness. She went to the fire department and her blood pressure was elevated. She decided to come to the emergency room. Over the past several months and perhaps increasingly over the past several days to weeks she has experienced chest tightness in the left subclavicular area with radiation into the left shoulder and more apparent with physical activity. On the day of admission, she had more than of this than she had previously experienced. At rest the discomfort tends to improve. There was associated shortness of breath. Because of this history of coronary CTA was performed and revealed only mild to at most moderate nonobstructive disease. Cardiac markers negative and EKGs were nonspecific. She is currently pain free.   PMH:   Past Medical History  Diagnosis Date  . Hypertension     . RLS (restless legs syndrome)   . Fibrocystic breast disease   . OSA (obstructive sleep apnea)   . Depression      PSH:   Past Surgical History  Procedure Date  . Shoulder surgery     right  . Total abdominal hysterectomy   . Cyst removed     on hand x 2  . Scar tissue removed     right foot    Allergies:  Diphenhydramine hcl; Imodium; Levofloxacin; Prednisone; and Sulfonamide derivatives Prior to Admit Meds:   (Not in a hospital admission) Fam HX:    Family History  Problem Relation Age of Onset  . Emphysema Father   . Allergies Sister   . Heart disease Mother   . Heart disease Sister   . Cancer Father     unsure what kind   Social HX:    History   Social History  . Marital Status: Married    Spouse Name: N/A    Number of Children: N/A  . 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 Smoking status: Current Every Day Smoker -- 1.0 packs/day for 35 years    Types: Cigarettes  . Smokeless tobacco: Not on file  . Alcohol Use: Not on file  . Drug Use: Not on file  . Sexually Active: Not on file   Other Topics Concern  . Not on file   Social History Narrative  . No narrative on file     Review of Systems: Denies stroke. No history of gastrointestinal disease. She denies syncope. No palpitations.  No orthopnea or PND. Denies claudication. No transient neurological complaints  Physical Exam: Blood pressure 128/72, pulse 64, temperature 98.2 F (36.8 C), temperature source Oral, resp. rate 17, height 5\' 4"  (1.626 m), weight 90.501 kg (199 lb 8.3 oz), SpO2 97.00%. Weight change:   Moderately obese. No acute distress. Skin color is clear.  HEENT exam is unremarkable.  Neck exam reveals no jugular vein distention or carotid bruits. The lungs are clear auscultation and percussion.  Cardiac exam reveals no rub, click, gallop, or murmur.  Abdomen soft. No bruits are heard.  Extremities reveal no edema.  Pulses are 2+ and symmetric in the radials femorals and posterior tibials bilaterally. Neurological exam is unremarkable to Labs:   Lab Results  Component Value Date   WBC 9.4 11/03/2011   HGB 15.4* 11/03/2011   HCT 44.2 11/03/2011   MCV 88.2 11/03/2011   PLT 274 11/03/2011    Lab 11/03/11 1248  NA 140  K 3.6  CL 105  CO2 23  BUN 8  CREATININE 0.64  CALCIUM 9.7  PROT 7.6  BILITOT 0.3  ALKPHOS 108  ALT 22  AST 19  GLUCOSE 100*     Radiology:  Dg Chest 2 View  11/03/2011  *RADIOLOGY REPORT*  Clinical Data: Chest pain, shortness of breath  CHEST - 2 VIEW  Comparison: 03/20/2006  Findings: Lungs are clear. No pleural effusion or pneumothorax.  Cardiomediastinal silhouette is within normal limits.  Visualized osseous structures are within normal limits.  IMPRESSION: No evidence of acute cardiopulmonary disease.   Original Report Authenticated By: Charline Bills, M.D.    Ct Heart Morp W/cta Cor W/score W/ca W/cm &/or Wo/cm  11/04/2011  *RADIOLOGY REPORT*  INDICATION:  Mid and left-sided chest pain and pressure, dizziness. History of hypertension and obstructive sleep apnea.  Long-time current smoker.  CT ANGIOGRAPHY OF THE HEART, CORONARY ARTERY, STRUCTURE, AND MORPHOLOGY  CONTRAST: 80mL OMNIPAQUE IOHEXOL 350 MG/ML.  COMPARISON:  None  TECHNIQUE:  CT angiography of the coronary vessels was performed on a 256 channel system using prospective ECG gating.  A scout image and noncontrast exam (for calcium scoring) were performed. Circulation time was measured using a test bolus.  Coronary CTA was performed with sub mm slice collimation during portions of the cardiac cycle (73%, 78%, 83%) during injection of iodinated contrast.  Imaging post processing was performed on an independent workstation creating multiplanar and 3-D images, and quantitative analysis of the heart and coronary arteries.  Note that this exam targets the heart and the chest was not imaged in its entirety.  PREMEDICATION:  Lopressor 100 mg, P.O. Lopressor 5 mg, IV Nitroglycerin 0.4 mcg, sublingual.  FINDINGS: Technical quality:  Good.  Slight patient motion blurred a few of the images. Heart rate:  62  CORONARY ARTERIES: Left main coronary artery:  Normal.  Left anterior descending:  Non-calcified plaque in the proximal LAD, just before the first diagonal, causing approximately 50% diameter stenosis; this may be artifactually increased as this is the area of patient motion.  Nonobstructive calcified plaque in the mid LAD, after the first diagonal.  Left circumflex:  Nonobstructive noncalcified plaque in the proximal left circumflex causing less than 25% diameter stenosis.  Right coronary artery:  Normal.  Posterior descending artery:  Normal.  Dominance:  Right.  CORONARY CALCIUM: Total Agatston Score:  11.92 (LAD) MESA database percentile:  89  CARDIAC MEASUREMENTS: Interventricular septum (6 - 12 mm):  12 mm LV posterior wall (6 - 12 mm):  13 mm LV  diameter in diastole (35 - 52 mm):  53 mm  AORTA AND PULMONARY MEASUREMENTS: Aortic root (21 - 40 mm):             20 mm  at the annulus             24 mm  at the sinuses of Valsalva             23 mm  at the sinotubular junction Ascending aorta ( <  40 mm):  31 mm Descending aorta ( <  40 mm):  25 mm Main pulmonary artery:  ( <  30 mm):  20 mm  EXTRACARDIAC FINDINGS: Emphysematous changes in the lungs.  Visualized mediastinum unremarkable.  Bone window images demonstrate minimal thoracic spondylosis.  Visualized liver and spleen unremarkable.  IMPRESSION:  1.  Mild to moderate coronary artery disease.  The patient's total coronary artery calcium score is 11.92, which is 89 percentile for patient's matched age and gender. 2.  Noncalcified plaque in the proximal LAD causing approximately 50% diameter stenosis (though this may be artifactually increased as there is patient motion). 3.  Noncalcified plaque in the proximal left circumflex artery causing less than 25% diameter stenosis. 4.   Right coronary artery dominance. 5.  Borderline left ventricular hypertrophy. 6.  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  Report was called to [facility], a nurse in the CDU, at 1205 hours on 11/04/2011.   Original Report Authenticated By: Arnell Sieving, M.D.    EKG:  Normal sinus rhythm with no acute ST-T wave change. EKG done today reveals nonspecific T wave flattenig.    Lesleigh Noe 11/04/2011 3:02 PM

## 2011-11-04 NOTE — ED Notes (Signed)
PA HAS GIVEN PT A COKE TO DRINK WHILE AWAITING CARDIOLOGY CONSULT. WILL HOLD ON GIVING PT FOOD UNTIL CARDIOLOGY HAS FINISHED EVAL

## 2011-11-05 NOTE — ED Provider Notes (Signed)
Medical screening examination/treatment/procedure(s) were conducted as a shared visit with non-physician practitioner(s) and myself.  I personally evaluated the patient during the encounter   Lelon Ikard, MD 11/05/11 1558 

## 2011-11-21 ENCOUNTER — Telehealth: Payer: Self-pay | Admitting: Pulmonary Disease

## 2011-11-21 NOTE — Telephone Encounter (Signed)
Looked in EMR: Pt saw Dr. Jearld Fenton. I called and made pt aware of this. She needed nothing further

## 2012-03-11 ENCOUNTER — Ambulatory Visit (INDEPENDENT_AMBULATORY_CARE_PROVIDER_SITE_OTHER): Payer: BC Managed Care – PPO | Admitting: Pulmonary Disease

## 2012-03-11 ENCOUNTER — Encounter: Payer: Self-pay | Admitting: Pulmonary Disease

## 2012-03-11 VITALS — BP 138/80 | HR 78 | Temp 97.8°F | Ht 64.0 in | Wt 204.0 lb

## 2012-03-11 DIAGNOSIS — G4733 Obstructive sleep apnea (adult) (pediatric): Secondary | ICD-10-CM

## 2012-03-11 NOTE — Progress Notes (Signed)
  Subjective:    Patient ID: Kristina Glover, female    DOB: 1960-05-14, 52 y.o.   MRN: [id number]  HPI Patient comes in today for followup of her obstructive sleep apnea.  She is wearing CPAP compliantly, but is overdue for a new mask.  Her CPAP machine is also malfunctioning, and is well overdue for a new device.  Prior to the malfunctioning, the patient states she slept well and had adequate daytime alertness.  Her family today shows good compliance with adequate control of her AHI.   Review of Systems  Constitutional: Negative for fever and unexpected weight change.  HENT: Positive for congestion, sore throat, rhinorrhea, sneezing, postnasal drip and sinus pressure. Negative for ear pain, nosebleeds, trouble swallowing and dental problem.        Patient is currently on abx for sinus infection  Eyes: Negative for redness and itching.  Respiratory: Positive for cough. Negative for chest tightness, shortness of breath and wheezing.   Cardiovascular: Positive for leg swelling. Negative for palpitations.  Gastrointestinal: Negative for nausea and vomiting.  Genitourinary: Negative for dysuria.  Musculoskeletal: Positive for joint swelling and arthralgias.  Skin: Negative for rash.  Neurological: Positive for headaches.  Hematological: Does not bruise/bleed easily.  Psychiatric/Behavioral: Negative for dysphoric mood. The patient is not nervous/anxious.        Objective:   Physical Exam Obese female in no acute distress Nose without purulence or discharge noted No skin breakdown or pressure necrosis from the CPAP mask Neck without lymphadenopathy or thyromegaly Lower extremities without edema, no cyanosis Alert and oriented, moves all 4 extremities.       Assessment & Plan:

## 2012-03-11 NOTE — Assessment & Plan Note (Signed)
The patient has done very well with CPAP, but now is having an issue with her CPAP machine which is not functioning properly, and is overdue for a new mask.  I will send an order to her medical equipment company for a new device and mask, and have encouraged her to work aggressively on weight loss.  If doing well, she will followup with me in one year.

## 2012-03-11 NOTE — Patient Instructions (Addendum)
Will get you a new cpap machine and mask Will refer you to another ENT for further evaluation Work on weight loss followup with me in one year if doing well.

## 2012-03-27 ENCOUNTER — Other Ambulatory Visit: Payer: Self-pay | Admitting: Otolaryngology

## 2012-03-27 ENCOUNTER — Ambulatory Visit
Admission: RE | Admit: 2012-03-27 | Discharge: 2012-03-27 | Disposition: A | Discharge status: Home / Self Care | Payer: BC Managed Care – PPO | Source: Ambulatory Visit | Attending: Otolaryngology | Admitting: Otolaryngology

## 2012-03-27 DIAGNOSIS — J329 Chronic sinusitis, unspecified: Secondary | ICD-10-CM

## 2013-03-11 ENCOUNTER — Ambulatory Visit: Payer: BC Managed Care – PPO | Admitting: Pulmonary Disease

## 2013-03-12 ENCOUNTER — Encounter: Payer: Self-pay | Admitting: Pulmonary Disease

## 2013-05-14 DIAGNOSIS — L570 Actinic keratosis: Secondary | ICD-10-CM | POA: Insufficient documentation

## 2013-06-01 DIAGNOSIS — L820 Inflamed seborrheic keratosis: Secondary | ICD-10-CM | POA: Insufficient documentation

## 2013-08-25 ENCOUNTER — Telehealth: Payer: Self-pay | Admitting: Pulmonary Disease

## 2013-08-25 NOTE — Telephone Encounter (Signed)
Call[facility]/ pt[facility] Okay per JC. Nothing further needed

## 2013-08-26 ENCOUNTER — Ambulatory Visit: Payer: BC Managed Care – PPO | Admitting: Pulmonary Disease

## 2013-09-08 ENCOUNTER — Ambulatory Visit (INDEPENDENT_AMBULATORY_CARE_PROVIDER_SITE_OTHER): Payer: BC Managed Care – PPO | Admitting: Pulmonary Disease

## 2013-09-08 ENCOUNTER — Encounter (INDEPENDENT_AMBULATORY_CARE_PROVIDER_SITE_OTHER): Payer: Self-pay

## 2013-09-08 ENCOUNTER — Encounter: Payer: Self-pay | Admitting: Pulmonary Disease

## 2013-09-08 VITALS — BP 134/80 | HR 80 | Temp 97.8°F | Ht 64.0 in | Wt 212.8 lb

## 2013-09-08 DIAGNOSIS — G4733 Obstructive sleep apnea (adult) (pediatric): Secondary | ICD-10-CM

## 2013-09-08 NOTE — Assessment & Plan Note (Signed)
The patient is doing very well from a sleep apnea standpoint, with good compliance on her CPAP and excellent control of her AHI. I've asked her to continue working on weight loss, and to keep up with her mask changes and supplies. She is not having any issues with her sleep or daytime sleepiness at this point.

## 2013-09-08 NOTE — Progress Notes (Signed)
   Subjective:    Patient ID: Kristina Glover, female    DOB: 1960/05/06, 53 y.o.   MRN: [id number]  HPI The patient comes in today for followup of her obstructive sleep apnea. At the last visit, she was referred to otolaryngology for possible upper airway surgery for her mild OSA. She underwent tonsillectomy, and feels this helped her tremendously. Unfortunately, she stopped smoking, which led to increased weight gain. She is now working on getting her weight down, and I suspected successful, she will no longer have sleep apnea. She is having no issues with her mask fit or pressure, and her downloaded shows excellent compliance over the last one year.   Review of Systems  Constitutional: Negative for fever and unexpected weight change.  HENT: Negative for congestion, dental problem, ear pain, nosebleeds, postnasal drip, rhinorrhea, sinus pressure, sneezing, sore throat and trouble swallowing.   Eyes: Negative for redness and itching.  Respiratory: Negative for cough, chest tightness, shortness of breath and wheezing.   Cardiovascular: Negative for palpitations and leg swelling.  Gastrointestinal: Negative for nausea and vomiting.  Genitourinary: Negative for dysuria.  Musculoskeletal: Negative for joint swelling.  Skin: Negative for rash.  Neurological: Negative for headaches.  Hematological: Does not bruise/bleed easily.  Psychiatric/Behavioral: Negative for dysphoric mood. The patient is not nervous/anxious.        Objective:   Physical Exam Overweight female in no acute distress Nose without purulence or discharge noted No skin breakdown or pressure necrosis from the CPAP mask Neck without lymphadenopathy or thyromegaly Lower extremities without edema, no cyanosis Alert and oriented, moves all 4 extremities.       Assessment & Plan:

## 2013-09-08 NOTE — Patient Instructions (Signed)
Continue on cpap, and keep up with mask changes and supplies. Work on weight loss, and if you are successful, would repeat your sleep test now that you have had a tonsillectomy. followup with again in one year, but call if you are successful with your weight loss and we can discuss further.

## 2014-06-02 ENCOUNTER — Other Ambulatory Visit (HOSPITAL_COMMUNITY): Payer: Self-pay | Admitting: Family Medicine

## 2014-06-02 DIAGNOSIS — Z1231 Encounter for screening mammogram for malignant neoplasm of breast: Secondary | ICD-10-CM

## 2014-06-03 ENCOUNTER — Ambulatory Visit (HOSPITAL_COMMUNITY): Payer: Self-pay

## 2014-06-08 ENCOUNTER — Ambulatory Visit (HOSPITAL_COMMUNITY)
Admission: RE | Admit: 2014-06-08 | Discharge: 2014-06-08 | Disposition: A | Discharge status: Home / Self Care | Payer: BC Managed Care – PPO | Source: Ambulatory Visit | Attending: Family Medicine | Admitting: Family Medicine

## 2014-06-08 DIAGNOSIS — Z1231 Encounter for screening mammogram for malignant neoplasm of breast: Secondary | ICD-10-CM

## 2014-08-29 ENCOUNTER — Ambulatory Visit: Payer: BC Managed Care – PPO | Admitting: Pulmonary Disease

## 2014-08-30 ENCOUNTER — Encounter: Payer: Self-pay | Admitting: Pulmonary Disease

## 2014-08-30 ENCOUNTER — Ambulatory Visit (INDEPENDENT_AMBULATORY_CARE_PROVIDER_SITE_OTHER): Payer: BC Managed Care – PPO | Admitting: Pulmonary Disease

## 2014-08-30 VITALS — BP 170/94 | HR 78 | Ht 64.0 in | Wt 226.0 lb

## 2014-08-30 DIAGNOSIS — R0602 Shortness of breath: Secondary | ICD-10-CM

## 2014-08-30 DIAGNOSIS — G4733 Obstructive sleep apnea (adult) (pediatric): Secondary | ICD-10-CM

## 2014-08-30 DIAGNOSIS — R0600 Dyspnea, unspecified: Secondary | ICD-10-CM | POA: Diagnosis not present

## 2014-08-30 NOTE — Assessment & Plan Note (Signed)
obstructive sleep apnea is well corrected by CPAP 12 cm  Weight loss encouraged, compliance with goal of at least 4-6 hrs every night is the expectation. Advised against medications with sedative side effects Cautioned against driving when sleepy - understanding that sleepiness will vary on a day to day basis

## 2014-08-30 NOTE — Assessment & Plan Note (Signed)
no obstruction Favor deconditioning  Albuterol prn

## 2014-08-30 NOTE — Patient Instructions (Signed)
obstructive sleep apnea is well corrected by CPAP 12 cm Breathing test shows mild effect of smoking Weight loss would help

## 2014-08-30 NOTE — Progress Notes (Signed)
   Subjective:    Patient ID: Kristina Glover, female    DOB: Apr 06, 1960, 54 y.o.   MRN: [id number]  HPI  53/F , ex smoker for followup of her obstructive sleep apnea.  She underwent tonsillectomy in 2014 , and this helped her tremendously. She takes adderall for ADHD She smoked 1 PPD x 35 y Unfortunately, smoking cessation caused increased weight gain.     Chief Complaint  Patient presents with  . Sleep Apnea    Wears CPAP every night.  needs new head gear and tubing.  c/o SOB during the day   Annual FU - gained about 45 lbs after smoking cessation C/o increased dyspnea x 2 weeks, no PND, orthopnea or chest pain or edema She is having no issues with her mask fit or pressure 08/2014 35m download on 12 cm >> good usage, no residuals, no leak  NPSG 2006:  AHI 11/hr  Review of Systems neg for any significant sore throat, dysphagia, itching, sneezing, nasal congestion or excess/ purulent secretions, fever, chills, sweats, unintended wt loss, pleuritic or exertional cp, hempoptysis, orthopnea pnd or change in chronic leg swelling. Also denies presyncope, palpitations, heartburn, abdominal pain, nausea, vomiting, diarrhea or change in bowel or urinary habits, dysuria,hematuria, rash, arthralgias, visual complaints, headache, numbness weakness or ataxia.     Objective:   Physical Exam  Gen. Pleasant, obese, in no distress ENT - no lesions, no post nasal drip Neck: No JVD, no thyromegaly, no carotid bruits Lungs: no use of accessory muscles, no dullness to percussion, decreased without rales or rhonchi  Cardiovascular: Rhythm regular, heart sounds  normal, no murmurs or gallops, no peripheral edema Musculoskeletal: No deformities, no cyanosis or clubbing , no tremors       Assessment & Plan:

## 2015-05-26 ENCOUNTER — Ambulatory Visit: Payer: Self-pay | Admitting: Physician Assistant

## 2015-06-02 ENCOUNTER — Encounter (HOSPITAL_COMMUNITY): Payer: Self-pay

## 2015-06-02 ENCOUNTER — Other Ambulatory Visit: Payer: Self-pay

## 2015-06-02 ENCOUNTER — Encounter (HOSPITAL_COMMUNITY)
Admission: RE | Admit: 2015-06-02 | Discharge: 2015-06-02 | Disposition: A | Discharge status: Home / Self Care | Payer: BC Managed Care – PPO | Source: Ambulatory Visit | Attending: Orthopedic Surgery | Admitting: Orthopedic Surgery

## 2015-06-02 DIAGNOSIS — G4733 Obstructive sleep apnea (adult) (pediatric): Secondary | ICD-10-CM | POA: Insufficient documentation

## 2015-06-02 DIAGNOSIS — M50121 Cervical disc disorder at C4-C5 level with radiculopathy: Secondary | ICD-10-CM | POA: Diagnosis not present

## 2015-06-02 DIAGNOSIS — Z01818 Encounter for other preprocedural examination: Secondary | ICD-10-CM | POA: Diagnosis not present

## 2015-06-02 DIAGNOSIS — R9431 Abnormal electrocardiogram [ECG] [EKG]: Secondary | ICD-10-CM | POA: Insufficient documentation

## 2015-06-02 DIAGNOSIS — Z7982 Long term (current) use of aspirin: Secondary | ICD-10-CM | POA: Diagnosis not present

## 2015-06-02 DIAGNOSIS — Z87891 Personal history of nicotine dependence: Secondary | ICD-10-CM | POA: Diagnosis not present

## 2015-06-02 DIAGNOSIS — I10 Essential (primary) hypertension: Secondary | ICD-10-CM | POA: Insufficient documentation

## 2015-06-02 DIAGNOSIS — Z79899 Other long term (current) drug therapy: Secondary | ICD-10-CM | POA: Insufficient documentation

## 2015-06-02 DIAGNOSIS — Z01812 Encounter for preprocedural laboratory examination: Secondary | ICD-10-CM | POA: Insufficient documentation

## 2015-06-02 HISTORY — DX: Anxiety disorder, unspecified: F41.9

## 2015-06-02 HISTORY — DX: Other specified postprocedural states: Z98.890

## 2015-06-02 HISTORY — DX: Other specified postprocedural states: R11.2

## 2015-06-02 HISTORY — DX: Unspecified osteoarthritis, unspecified site: M19.90

## 2015-06-02 HISTORY — DX: Headache, unspecified: R51.9

## 2015-06-02 HISTORY — DX: Headache: R51

## 2015-06-02 LAB — CBC
HCT: 41 % (ref 36.0–46.0)
Hemoglobin: 13.2 g/dL (ref 12.0–15.0)
MCH: 29.4 pg (ref 26.0–34.0)
MCHC: 32.2 g/dL (ref 30.0–36.0)
MCV: 91.3 fL (ref 78.0–100.0)
PLATELETS: 209 10*3/uL (ref 150–400)
RBC: 4.49 MIL/uL (ref 3.87–5.11)
RDW: 13.7 % (ref 11.5–15.5)
WBC: 6.5 10*3/uL (ref 4.0–10.5)

## 2015-06-02 LAB — BASIC METABOLIC PANEL
Anion gap: 10 (ref 5–15)
BUN: 10 mg/dL (ref 6–20)
CALCIUM: 9.6 mg/dL (ref 8.9–10.3)
CO2: 24 mmol/L (ref 22–32)
CREATININE: 0.77 mg/dL (ref 0.44–1.00)
Chloride: 108 mmol/L (ref 101–111)
GFR calc Af Amer: >60 mL/min (ref >60)
GLUCOSE: 150 mg/dL — AB (ref 65–99)
POTASSIUM: 3.6 mmol/L (ref 3.5–5.1)
SODIUM: 142 mmol/L (ref 135–145)

## 2015-06-02 LAB — SURGICAL PCR SCREEN
MRSA, PCR: NEGATIVE
Staphylococcus aureus: NEGATIVE

## 2015-06-02 NOTE — Progress Notes (Signed)
ANESTHESIA TO REVIEW CHART.

## 2015-06-02 NOTE — Progress Notes (Signed)
Requested stress test, echo, ov from [facility] cardiology .[phone]

## 2015-06-02 NOTE — Pre-Procedure Instructions (Signed)
Kristina Glover  06/02/2015      [facility] - [place], [place] - [address] [address] [address] [address] [place] [id number] Phone: [phone] Fax: [phone]    Your procedure is scheduled on    Thursday  06/08/15  [facility] at 1000 A.M.  Call this number if you have problems the morning of surgery:  [phone]   Remember:  Do not eat food or drink liquids after midnight.  Take these medicines the morning of surgery with A SIP OF WATER   BUPROPION (WELLBUTRIN), METOPROLOL (TOPROL), SERTRALINE (ZOLOFT)     (STOP ASPIRIN, IBUPROFEN/ ADVIL/ MOTRIN, GOODY POWDERS, BCS, HERBAL MEDICINES)   Do not wear jewelry, make-up or nail polish.  Do not wear lotions, powders, or perfumes.  You may wear deodorant.  Do not shave 48 hours prior to surgery.  Men may shave face and neck.  Do not bring valuables to the hospital.  [facility] is not responsible for any belongings or valuables.  Contacts, dentures or bridgework may not be worn into surgery.  Leave your suitcase in the car.  After surgery it may be brought to your room.  For patients admitted to the hospital, discharge time will be determined by your treatment team.  Patients discharged the day of surgery will not be allowed to drive home.   Name and phone number of your driver:     Special instructions:  [facility] - Preparing for Surgery  Before surgery, you can play an important role.  Because skin is not sterile, your skin needs to be as free of germs as possible.  You can reduce the number of germs on you skin by washing with CHG (chlorahexidine gluconate) soap before surgery.  CHG is an antiseptic cleaner which kills germs and bonds with the skin to continue killing germs even after washing.  Please DO NOT use if you have an allergy to CHG or antibacterial soaps.  If your skin becomes reddened/irritated stop using the CHG and inform your nurse when you arrive  at Short Stay.  Do not shave (including legs and underarms) for at least 48 hours prior to the first CHG shower.  You may shave your face.  Please follow these instructions carefully:   1.  Shower with CHG Soap the night before surgery and the                                morning of Surgery.  2.  If you choose to wash your hair, wash your hair first as usual with your       normal shampoo.  3.  After you shampoo, rinse your hair and body thoroughly to remove the                      Shampoo.  4.  Use CHG as you would any other liquid soap.  You can apply chg directly       to the skin and wash gently with scrungie or a clean washcloth.  5.  Apply the CHG Soap to your body ONLY FROM THE NECK DOWN.        Do not use on open wounds or open sores.  Avoid contact with your eyes,       ears, mouth and genitals (private parts).  Wash genitals (private parts)  with your normal soap.  6.  Wash thoroughly, paying special attention to the area where your surgery        will be performed.  7.  Thoroughly rinse your body with warm water from the neck down.  8.  DO NOT shower/wash with your normal soap after using and rinsing off       the CHG Soap.  9.  Pat yourself dry with a clean towel.            10.  Wear clean pajamas.            11.  Place clean sheets on your bed the night of your first shower and do not        sleep with pets.  Day of Surgery  Do not apply any lotions/deoderants the morning of surgery.  Please wear clean clothes to the hospital/surgery center.    Please read over the following fact sheets that you were given. Pain Booklet, Coughing and Deep Breathing, MRSA Information and Surgical Site Infection Prevention

## 2015-06-05 NOTE — Progress Notes (Signed)
Anesthesia Chart Review:  Pt is a 55 year old female scheduled for C4-6 ACDF on 06/08/2015 with Dr. Shon BatonBrooks.   PMH includes:  HTN, OSA, post-op N/V. Former smoker. BMI 39.5  Medications include: adderall XR, ASA, lasix, losartan-hctz, metoprolol  Preoperative labs reviewed.    EKG 06/02/15: NSR. Nonspecific ST and T wave abnormality  Nuclear stress test 11/07/11:  - Post stress LV normal in size. Observed defect consistent with breast attenuation artifact. EF 86% - Exercise capacity 7.0 METS - Normal myocardial perfusion scan demonstrating attenuation artifact in the anterior region of the myocardium. No ischemia/scar in remaining myocardium.   If no changes, I anticipate pt can proceed with surgery as scheduled.   Rica Mastngela Lavren Lewan, FNP-BC [facility]/Anesthesiology Phone: [phone] 06/05/2015 1:45 PM

## 2015-06-07 NOTE — Anesthesia Preprocedure Evaluation (Addendum)
Anesthesia Evaluation  Patient identified by MRN, date of birth, ID band Patient awake    Reviewed: Allergy & Precautions, H&P , NPO status , Patient's Chart, lab work & pertinent test results  History of Anesthesia Complications (+) PONV and history of anesthetic complications  Airway Mallampati: II  TM Distance: >3 FB Neck ROM: full    Dental  (+) Dental Advisory Given, Caps All upper front are capped:   Pulmonary shortness of breath and with exertion, sleep apnea and Continuous Positive Airway Pressure Ventilation , former smoker,    Pulmonary exam normal breath sounds clear to auscultation       Cardiovascular Exercise Tolerance: Good hypertension, negative cardio ROS Normal cardiovascular exam Rhythm:regular Rate:Normal     Neuro/Psych negative neurological ROS  negative psych ROS   GI/Hepatic negative GI ROS, Neg liver ROS,   Endo/Other  negative endocrine ROSMorbid obesity  Renal/GU negative Renal ROS  negative genitourinary   Musculoskeletal   Abdominal (+) + obese,   Peds  Hematology negative hematology ROS (+)   Anesthesia Other Findings   Reproductive/Obstetrics negative OB ROS                           Anesthesia Physical Anesthesia Plan  ASA: III  Anesthesia Plan: General   Post-op Pain Management:    Induction: Intravenous  Airway Management Planned: Oral ETT  Additional Equipment:   Intra-op Plan:   Post-operative Plan: Extubation in OR  Informed Consent: I have reviewed the patients History and Physical, chart, labs and discussed the procedure including the risks, benefits and alternatives for the proposed anesthesia with the patient or authorized representative who has indicated his/her understanding and acceptance.   Dental Advisory Given  Plan Discussed with: CRNA  Anesthesia Plan Comments:         Anesthesia Quick Evaluation

## 2015-06-08 ENCOUNTER — Observation Stay (HOSPITAL_COMMUNITY)
Admission: RE | Admit: 2015-06-08 | Discharge: 2015-06-09 | Disposition: A | Discharge status: Home / Self Care | Payer: BC Managed Care – PPO | Source: Ambulatory Visit | Attending: Orthopedic Surgery | Admitting: Orthopedic Surgery

## 2015-06-08 ENCOUNTER — Ambulatory Visit (HOSPITAL_COMMUNITY): Payer: BC Managed Care – PPO | Admitting: Anesthesiology

## 2015-06-08 ENCOUNTER — Encounter (HOSPITAL_COMMUNITY): Payer: Self-pay | Admitting: *Deleted

## 2015-06-08 ENCOUNTER — Ambulatory Visit (HOSPITAL_COMMUNITY): Payer: BC Managed Care – PPO | Admitting: Vascular Surgery

## 2015-06-08 ENCOUNTER — Ambulatory Visit (HOSPITAL_COMMUNITY): Payer: BC Managed Care – PPO

## 2015-06-08 ENCOUNTER — Encounter (HOSPITAL_COMMUNITY)
Admission: RE | Disposition: A | Discharge status: Home / Self Care | Payer: Self-pay | Source: Ambulatory Visit | Attending: Orthopedic Surgery

## 2015-06-08 DIAGNOSIS — G4730 Sleep apnea, unspecified: Secondary | ICD-10-CM | POA: Insufficient documentation

## 2015-06-08 DIAGNOSIS — I10 Essential (primary) hypertension: Secondary | ICD-10-CM | POA: Diagnosis not present

## 2015-06-08 DIAGNOSIS — M50122 Cervical disc disorder at C5-C6 level with radiculopathy: Principal | ICD-10-CM | POA: Insufficient documentation

## 2015-06-08 DIAGNOSIS — Z419 Encounter for procedure for purposes other than remedying health state, unspecified: Secondary | ICD-10-CM

## 2015-06-08 DIAGNOSIS — M542 Cervicalgia: Secondary | ICD-10-CM | POA: Diagnosis present

## 2015-06-08 DIAGNOSIS — M4722 Other spondylosis with radiculopathy, cervical region: Secondary | ICD-10-CM | POA: Diagnosis present

## 2015-06-08 HISTORY — PX: ANTERIOR CERVICAL DECOMP/DISCECTOMY FUSION: SHX1161

## 2015-06-08 SURGERY — ANTERIOR CERVICAL DECOMPRESSION/DISCECTOMY FUSION 2 LEVELS
Anesthesia: General | Site: Spine Cervical

## 2015-06-08 MED ORDER — METHOCARBAMOL 500 MG PO TABS: 500.0000 mg | ORAL_TABLET | Freq: Three times a day (TID) | ORAL | Status: DC | PRN

## 2015-06-08 MED ORDER — ROCURONIUM BROMIDE 50 MG/5ML IV SOLN
INTRAVENOUS | Status: AC
  Filled 2015-06-08: qty 1

## 2015-06-08 MED ORDER — LOSARTAN POTASSIUM 50 MG PO TABS
100.0000 mg | ORAL_TABLET | Freq: Every day | ORAL | Status: DC
  Administered 2015-06-08: 100 mg via ORAL
  Filled 2015-06-08: qty 2

## 2015-06-08 MED ORDER — DEXAMETHASONE 4 MG PO TABS
4.0000 mg | ORAL_TABLET | Freq: Four times a day (QID) | ORAL | Status: AC
  Administered 2015-06-08 – 2015-06-09 (×3): 4 mg via ORAL
  Filled 2015-06-08 (×3): qty 1

## 2015-06-08 MED ORDER — LOSARTAN POTASSIUM-HCTZ 100-12.5 MG PO TABS: 1.0000 | ORAL_TABLET | Freq: Every day | ORAL | Status: DC

## 2015-06-08 MED ORDER — SUGAMMADEX SODIUM 200 MG/2ML IV SOLN
INTRAVENOUS | Status: DC | PRN
  Administered 2015-06-08: 300 mg via INTRAVENOUS

## 2015-06-08 MED ORDER — MIDAZOLAM HCL 5 MG/5ML IJ SOLN
INTRAMUSCULAR | Status: DC | PRN
  Administered 2015-06-08: 2 mg via INTRAVENOUS

## 2015-06-08 MED ORDER — ACETAMINOPHEN 10 MG/ML IV SOLN
INTRAVENOUS | Status: DC | PRN
  Administered 2015-06-08: 1000 mg via INTRAVENOUS

## 2015-06-08 MED ORDER — FENTANYL CITRATE (PF) 250 MCG/5ML IJ SOLN
INTRAMUSCULAR | Status: AC
  Filled 2015-06-08: qty 5

## 2015-06-08 MED ORDER — SUGAMMADEX SODIUM 500 MG/5ML IV SOLN
INTRAVENOUS | Status: AC
  Filled 2015-06-08: qty 5

## 2015-06-08 MED ORDER — PHENOL 1.4 % MT LIQD: 1.0000 | OROMUCOSAL | Status: DC | PRN

## 2015-06-08 MED ORDER — ACETAMINOPHEN 10 MG/ML IV SOLN
INTRAVENOUS | Status: AC
  Filled 2015-06-08: qty 100

## 2015-06-08 MED ORDER — METHOCARBAMOL 1000 MG/10ML IJ SOLN
500.0000 mg | Freq: Four times a day (QID) | INTRAMUSCULAR | Status: DC | PRN
  Filled 2015-06-08: qty 5

## 2015-06-08 MED ORDER — PROPOFOL 10 MG/ML IV BOLUS
INTRAVENOUS | Status: DC | PRN
  Administered 2015-06-08: 50 mg via INTRAVENOUS

## 2015-06-08 MED ORDER — SODIUM CHLORIDE 0.9% FLUSH
3.0000 mL | Freq: Two times a day (BID) | INTRAVENOUS | Status: DC
  Administered 2015-06-08: 3 mL via INTRAVENOUS

## 2015-06-08 MED ORDER — ONDANSETRON HCL 4 MG/2ML IJ SOLN
INTRAMUSCULAR | Status: DC | PRN
  Administered 2015-06-08: 4 mg via INTRAVENOUS

## 2015-06-08 MED ORDER — HYDROCHLOROTHIAZIDE 12.5 MG PO CAPS
12.5000 mg | ORAL_CAPSULE | Freq: Every day | ORAL | Status: DC
  Administered 2015-06-08: 12.5 mg via ORAL
  Filled 2015-06-08: qty 1

## 2015-06-08 MED ORDER — PROPOFOL 10 MG/ML IV BOLUS
INTRAVENOUS | Status: AC
  Filled 2015-06-08: qty 20

## 2015-06-08 MED ORDER — ROCURONIUM BROMIDE 100 MG/10ML IV SOLN
INTRAVENOUS | Status: DC | PRN
  Administered 2015-06-08: 50 mg via INTRAVENOUS

## 2015-06-08 MED ORDER — ONDANSETRON HCL 4 MG/2ML IJ SOLN: 4.0000 mg | INTRAMUSCULAR | Status: DC | PRN

## 2015-06-08 MED ORDER — DEXAMETHASONE SODIUM PHOSPHATE 4 MG/ML IJ SOLN
INTRAMUSCULAR | Status: DC | PRN
  Administered 2015-06-08: 10 mg via INTRAVENOUS

## 2015-06-08 MED ORDER — CEFAZOLIN SODIUM 1-5 GM-% IV SOLN
1.0000 g | Freq: Three times a day (TID) | INTRAVENOUS | Status: AC
  Administered 2015-06-08 (×2): 1 g via INTRAVENOUS
  Filled 2015-06-08 (×2): qty 50

## 2015-06-08 MED ORDER — METOPROLOL SUCCINATE ER 25 MG PO TB24
50.0000 mg | ORAL_TABLET | ORAL | Status: AC
  Filled 2015-06-08: qty 1

## 2015-06-08 MED ORDER — LACTATED RINGERS IV SOLN: INTRAVENOUS | Status: DC

## 2015-06-08 MED ORDER — LIDOCAINE HCL (CARDIAC) 20 MG/ML IV SOLN
INTRAVENOUS | Status: DC | PRN
  Administered 2015-06-08: 60 mg via INTRAVENOUS

## 2015-06-08 MED ORDER — FUROSEMIDE 20 MG PO TABS: 20.0000 mg | ORAL_TABLET | Freq: Every day | ORAL | Status: DC | PRN

## 2015-06-08 MED ORDER — 0.9 % SODIUM CHLORIDE (POUR BTL) OPTIME
TOPICAL | Status: DC | PRN
  Administered 2015-06-08: 1000 mL

## 2015-06-08 MED ORDER — MENTHOL 3 MG MT LOZG
1.0000 | LOZENGE | OROMUCOSAL | Status: DC | PRN
Start: 2015-06-08 — End: 2015-06-09

## 2015-06-08 MED ORDER — THROMBIN 20000 UNITS EX SOLR
CUTANEOUS | Status: AC
  Filled 2015-06-08: qty 20000

## 2015-06-08 MED ORDER — CEFAZOLIN SODIUM-DEXTROSE 2-4 GM/100ML-% IV SOLN
2.0000 g | INTRAVENOUS | Status: AC
  Administered 2015-06-08: 2 g via INTRAVENOUS
  Filled 2015-06-08: qty 100

## 2015-06-08 MED ORDER — METOPROLOL SUCCINATE ER 25 MG PO TB24
50.0000 mg | ORAL_TABLET | Freq: Every day | ORAL | Status: DC
  Filled 2015-06-08: qty 2

## 2015-06-08 MED ORDER — HEMOSTATIC AGENTS (NO CHARGE) OPTIME
TOPICAL | Status: DC | PRN
  Administered 2015-06-08: 1 via TOPICAL

## 2015-06-08 MED ORDER — SODIUM CHLORIDE 0.9% FLUSH: 3.0000 mL | INTRAVENOUS | Status: DC | PRN

## 2015-06-08 MED ORDER — METHOCARBAMOL 500 MG PO TABS
500.0000 mg | ORAL_TABLET | Freq: Four times a day (QID) | ORAL | Status: DC | PRN
  Administered 2015-06-08: 500 mg via ORAL
  Filled 2015-06-08: qty 1

## 2015-06-08 MED ORDER — SUCCINYLCHOLINE CHLORIDE 20 MG/ML IJ SOLN
INTRAMUSCULAR | Status: DC | PRN
  Administered 2015-06-08: 140 mg via INTRAVENOUS

## 2015-06-08 MED ORDER — OXYCODONE-ACETAMINOPHEN 10-325 MG PO TABS: 1.0000 | ORAL_TABLET | ORAL | Status: DC | PRN

## 2015-06-08 MED ORDER — FENTANYL CITRATE (PF) 250 MCG/5ML IJ SOLN
INTRAMUSCULAR | Status: DC | PRN
  Administered 2015-06-08 (×3): 100 ug via INTRAVENOUS
  Administered 2015-06-08 (×2): 50 ug via INTRAVENOUS
  Administered 2015-06-08: 100 ug via INTRAVENOUS

## 2015-06-08 MED ORDER — DEXAMETHASONE SODIUM PHOSPHATE 10 MG/ML IJ SOLN
INTRAMUSCULAR | Status: AC
  Filled 2015-06-08: qty 1

## 2015-06-08 MED ORDER — BUPIVACAINE-EPINEPHRINE 0.25% -1:200000 IJ SOLN
INTRAMUSCULAR | Status: DC | PRN
  Administered 2015-06-08: 6 mL

## 2015-06-08 MED ORDER — ONDANSETRON HCL 4 MG PO TABS: 4.0000 mg | ORAL_TABLET | Freq: Three times a day (TID) | ORAL | Status: DC | PRN

## 2015-06-08 MED ORDER — MORPHINE SULFATE (PF) 2 MG/ML IV SOLN: 1.0000 mg | INTRAVENOUS | Status: DC | PRN

## 2015-06-08 MED ORDER — MIDAZOLAM HCL 2 MG/2ML IJ SOLN
INTRAMUSCULAR | Status: AC
  Filled 2015-06-08: qty 2

## 2015-06-08 MED ORDER — BUPROPION HCL ER (XL) 150 MG PO TB24
150.0000 mg | ORAL_TABLET | Freq: Every day | ORAL | Status: DC
  Administered 2015-06-08: 150 mg via ORAL
  Filled 2015-06-08 (×2): qty 1

## 2015-06-08 MED ORDER — SERTRALINE HCL 50 MG PO TABS
200.0000 mg | ORAL_TABLET | Freq: Every day | ORAL | Status: DC
  Administered 2015-06-08: 200 mg via ORAL
  Filled 2015-06-08: qty 4

## 2015-06-08 MED ORDER — DEXAMETHASONE SODIUM PHOSPHATE 4 MG/ML IJ SOLN: 4.0000 mg | Freq: Four times a day (QID) | INTRAMUSCULAR | Status: AC

## 2015-06-08 MED ORDER — BUPIVACAINE-EPINEPHRINE (PF) 0.25% -1:200000 IJ SOLN
INTRAMUSCULAR | Status: AC
  Filled 2015-06-08: qty 30

## 2015-06-08 MED ORDER — THROMBIN 20000 UNITS EX SOLR
CUTANEOUS | Status: DC | PRN
  Administered 2015-06-08: 20 mL via TOPICAL

## 2015-06-08 MED ORDER — LACTATED RINGERS IV SOLN
INTRAVENOUS | Status: DC | PRN
  Administered 2015-06-08 (×2): via INTRAVENOUS

## 2015-06-08 MED ORDER — SODIUM CHLORIDE 0.9 % IV SOLN: 250.0000 mL | INTRAVENOUS | Status: DC

## 2015-06-08 MED ORDER — OXYCODONE HCL 5 MG PO TABS
5.0000 mg | ORAL_TABLET | ORAL | Status: DC | PRN
  Administered 2015-06-08 – 2015-06-09 (×3): 10 mg via ORAL
  Filled 2015-06-08 (×3): qty 2

## 2015-06-08 MED ORDER — DEXMEDETOMIDINE HCL 200 MCG/2ML IV SOLN
INTRAVENOUS | Status: DC | PRN
  Administered 2015-06-08 (×3): 12 ug via INTRAVENOUS

## 2015-06-08 SURGICAL SUPPLY — 68 items
BLADE SURG ROTATE 9660 (MISCELLANEOUS) IMPLANT
BUR EGG ELITE 4.0 (BURR) IMPLANT
BUR EGG ELITE 4.0MM (BURR)
BUR MATCHSTICK NEURO 3.0 LAGG (BURR) IMPLANT
CANISTER SUCTION 2500CC (MISCELLANEOUS) ×3 IMPLANT
CLOSURE STERI-STRIP 1/2X4 (GAUZE/BANDAGES/DRESSINGS) ×1
CLSR STERI-STRIP ANTIMIC 1/2X4 (GAUZE/BANDAGES/DRESSINGS) ×2 IMPLANT
CORDS BIPOLAR (ELECTRODE) ×3 IMPLANT
COVER SURGICAL LIGHT HANDLE (MISCELLANEOUS) ×6 IMPLANT
CRADLE DONUT ADULT HEAD (MISCELLANEOUS) ×3 IMPLANT
DECANTER SPIKE VIAL GLASS SM (MISCELLANEOUS) ×3 IMPLANT
DEVICE ENDSKLTN IMPL 16X14X7X6 (Neuro Prosthesis/Implant) ×2 IMPLANT
DRAPE C-ARM 42X72 X-RAY (DRAPES) ×3 IMPLANT
DRAPE POUCH INSTRU U-SHP 10X18 (DRAPES) ×6 IMPLANT
DRAPE SURG 17X23 STRL (DRAPES) ×3 IMPLANT
DRAPE U-SHAPE 47X51 STRL (DRAPES) ×3 IMPLANT
DRSG AQUACEL AG ADV 3.5X 6 (GAUZE/BANDAGES/DRESSINGS) ×3 IMPLANT
DRSG MEPILEX BORDER 4X4 (GAUZE/BANDAGES/DRESSINGS) ×3 IMPLANT
DURAPREP 6ML APPLICATOR 50/CS (WOUND CARE) ×3 IMPLANT
ELECT COATED BLADE 2.86 ST (ELECTRODE) ×3 IMPLANT
ELECT PENCIL ROCKER SW 15FT (MISCELLANEOUS) ×3 IMPLANT
ELECT REM PT RETURN 9FT ADLT (ELECTROSURGICAL) ×3
ELECTRODE REM PT RTRN 9FT ADLT (ELECTROSURGICAL) ×1 IMPLANT
ENDOSKELETON IMPLANT 16X14X7X6 (Neuro Prosthesis/Implant) ×6 IMPLANT
GLOVE BIO SURGEON STRL SZ 6.5 (GLOVE) ×2 IMPLANT
GLOVE BIO SURGEONS STRL SZ 6.5 (GLOVE) ×1
GLOVE BIOGEL PI IND STRL 6.5 (GLOVE) ×1 IMPLANT
GLOVE BIOGEL PI IND STRL 8.5 (GLOVE) ×1 IMPLANT
GLOVE BIOGEL PI INDICATOR 6.5 (GLOVE) ×2
GLOVE BIOGEL PI INDICATOR 8.5 (GLOVE) ×2
GLOVE SS BIOGEL STRL SZ 8.5 (GLOVE) ×1 IMPLANT
GLOVE SUPERSENSE BIOGEL SZ 8.5 (GLOVE) ×2
GOWN STRL REUS W/ TWL XL LVL3 (GOWN DISPOSABLE) ×2 IMPLANT
GOWN STRL REUS W/TWL 2XL LVL3 (GOWN DISPOSABLE) ×6 IMPLANT
GOWN STRL REUS W/TWL XL LVL3 (GOWN DISPOSABLE) ×4
KIT BASIN OR (CUSTOM PROCEDURE TRAY) ×3 IMPLANT
KIT ROOM TURNOVER OR (KITS) ×3 IMPLANT
NEEDLE SPNL 18GX3.5 QUINCKE PK (NEEDLE) ×3 IMPLANT
NS IRRIG 1000ML POUR BTL (IV SOLUTION) ×6 IMPLANT
PACK ORTHO CERVICAL (CUSTOM PROCEDURE TRAY) ×3 IMPLANT
PACK UNIVERSAL I (CUSTOM PROCEDURE TRAY) ×3 IMPLANT
PAD ARMBOARD 7.5X6 YLW CONV (MISCELLANEOUS) ×6 IMPLANT
PATTIES SURGICAL .25X.25 (GAUZE/BANDAGES/DRESSINGS) IMPLANT
PIN DISTRACTION 14 (PIN) ×6 IMPLANT
PLATE TWO LEVEL SKYLINE 30MM (Plate) ×3 IMPLANT
PUTTY BONE DBX 5CC MIX (Putty) ×3 IMPLANT
RESTRAINT LIMB HOLDER UNIV (RESTRAINTS) ×3 IMPLANT
SCREW SKYLINE 14MM SD-VA (Screw) ×18 IMPLANT
SPONGE INTESTINAL PEANUT (DISPOSABLE) ×6 IMPLANT
SPONGE LAP 4X18 X RAY DECT (DISPOSABLE) ×6 IMPLANT
SPONGE SURGIFOAM ABS GEL 100 (HEMOSTASIS) ×3 IMPLANT
SURGIFLO W/THROMBIN 8M KIT (HEMOSTASIS) ×3 IMPLANT
SUT BONE WAX W31G (SUTURE) ×3 IMPLANT
SUT MON AB 3-0 SH 27 (SUTURE) ×2
SUT MON AB 3-0 SH27 (SUTURE) ×1 IMPLANT
SUT SILK 2 0 (SUTURE) ×2
SUT SILK 2-0 18XBRD TIE 12 (SUTURE) ×1 IMPLANT
SUT STRATAFIX MNCRL+ 3-0 PS-2 (SUTURE) ×1
SUT STRATAFIX MONOCRYL 3-0 (SUTURE) ×2
SUT VIC AB 2-0 CT1 18 (SUTURE) ×3 IMPLANT
SUTURE STRATFX MNCRL+ 3-0 PS-2 (SUTURE) ×1 IMPLANT
SYR BULB IRRIGATION 50ML (SYRINGE) ×3 IMPLANT
SYR CONTROL 10ML LL (SYRINGE) ×3 IMPLANT
TAPE CLOTH 4X10 WHT NS (GAUZE/BANDAGES/DRESSINGS) ×3 IMPLANT
TAPE UMBILICAL COTTON 1/8X30 (MISCELLANEOUS) ×3 IMPLANT
TOWEL OR 17X24 6PK STRL BLUE (TOWEL DISPOSABLE) ×3 IMPLANT
TOWEL OR 17X26 10 PK STRL BLUE (TOWEL DISPOSABLE) ×3 IMPLANT
WATER STERILE IRR 1000ML POUR (IV SOLUTION) IMPLANT

## 2015-06-08 NOTE — Brief Op Note (Signed)
06/08/2015  10:46 AM  PATIENT:  Jasmine DecemberSharon A Peeters  55 y.o. 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   PROCEDURE:  Procedure(s): ANTERIOR CERVICAL DISCECTOMY FUSION C4-C5, C5-C6     (2 LEVELS)  (N/A)  SURGEON:  Surgeon(s) and Role:    * Venita Lickahari Renato Spellman, MD - Primary  PHYSICIAN ASSISTANT:   ASSISTANTS: Carmen Mayo   ANESTHESIA:   general  EBL:  Total I/O In: 1000 [I.V.:1000] Out: 50 [Blood:50]  BLOOD ADMINISTERED:none  DRAINS: none   LOCAL MEDICATIONS USED:  MARCAINE     SPECIMEN:  No Specimen  DISPOSITION OF SPECIMEN:  N/A  COUNTS:  YES  TOURNIQUET:  * No tourniquets in log *  DICTATION: .Other Dictation: Dictation Number [id number]  PLAN OF CARE: Admit for overnight observation  PATIENT DISPOSITION:  PACU - hemodynamically stable.

## 2015-06-08 NOTE — Op Note (Signed)
NAME:  Kristina Glover, Kristina Glover                  ACCOUNT NO.:  [id number]  MEDICAL RECORD NO.:  [id number]  LOCATION:  [facility]                         FACILITY:  [facility]  PHYSICIAN:  Diedre Maclellan D. Shon Baton, M.D. DATE OF BIRTH:  Jan 15, 1961  DATE OF PROCEDURE:  06/08/2015 DATE OF DISCHARGE:                              OPERATIVE REPORT   PREOPERATIVE DIAGNOSIS:  Cervical spondylotic radiculopathy, left side, C4-5 and C5-6.  POSTOPERATIVE DIAGNOSIS:  Cervical spondylotic radiculopathy, left side, C4-5 and C5-6.  OPERATIVE PROCEDURE:  Anterior cervical diskectomy and fusion, C4-5 and C5-6.  IMPLANT USED:  NanoLOCK Titan Titanium intervertebral spacer, size 7 medium at both levels, packed with DBX mix with a DePuy Skyline anterior cervical plate affixed with 14-mm screws.  FIRST ASSISTANT:  Anette Riedel, my PA.  COMPLICATIONS:  No intraoperative complications.  HISTORY:  This is a very pleasant 55 year old woman who has been having significant neck and neuropathic left arm pain.  Attempts at conservative management have failed.  Because of pain, weakness and ongoing symptoms, we elected to proceed with surgery.  All appropriate risks, benefits, and alternatives were discussed with the patient and consent was obtained.  OPERATIVE NOTE:  The patient was brought to the operating room, placed supine on the operating table.  After successful induction of general anesthesia and endotracheal intubation, TEDs and SCDs were applied.  A blood pressure cuff was placed underneath the scapula to restore lordosis.  IV pressure bag was placed underneath the shoulder blades and the anterior cervical spine was prepped and draped.  Pressure bag was inflated to restore normal lordosis.  Time-out was taken to confirm patient, procedure, and all other pertinent important data.  Lateral fluoro view was used to confirm the C5-6 level and this area was infiltrated with 0.25% Marcaine.  The midline incision was made  starting from the midline proceeding to the left.  Sharp dissection was carried out down to the platysma.  The platysma was sharply incised and I performed a standard Smith-Robinson approach to the anterior cervical spine.  I sharply dissected through the deep cervical fascia along the medial border of the sternocleidomastoid and mobilized the omohyoid muscle.  This was released from its sling, but I did not see the need to sacrifice it.  I then mobilized the prevertebral fascia and exposed the anterior cervical spine.  Self-retaining retractors were placed and the esophagus was protected on the right and the carotid sheath was protected on the left.  A needle was placed into the disk space.  An intraoperative x-ray was taken to confirm that I was at the C4-5 level. Once this was confirmed, I used bipolar electrocautery to mobilize the longus colli muscles from the midbody of C4 to the midbody of C6.  Once this was done bilaterally, I then placed the Caspar retracting blades underneath the longus colli muscle, deflated the endotracheal cuff, expanded the retractor blade system and reinflated the cuff.  I had excellent visualization of the 5-6 disk space.  An annulotomy was created using a 15-blade scalpel and then using pituitary rongeurs, curettes and Kerrison rongeurs, I removed the bulk of the disk material. I then used a 2 and 3-mm Kerrison punch  to trim down the anterior osteophyte from the inferior aspect of the C5 vertebral body.  This provided excellent visualization.  Distraction pins were placed into the 5 and 6 vertebral bodies.  I gently distracted the space.  Using fine neuro-curettes, I continued my dissection posteriorly and then used a 1- mm Kerrison to trim down the posterior osteophyte ridge from the vertebral bodies of 5 and 6.  I then used my fine nerve hook to develop a plane underneath the PLL, and then I used my 1-mm Kerrison to resect the PLL and posterior annulus.   I removed the disk fragment that was on the left-hand side.  I undercut the uncovertebral joints.  I could now sweep my nerve hook underneath the left side under the uncovertebral joints and then superiorly and inferiorly.  At this point with the decompression adequately performed, I rasped the endplates, so I had bleeding subchondral bone and then I trialed intervertebral spacers.  I elected to use the size 7 medium lordotic nanoLOCK cage.  This was obtained, packed with DBX mix, and malleted to the final position.  The graft at the 5-6 was well position and was well fitting.  At this point, I removed the distraction pin from the body of 6 and placed it into the bodies of C4 and using the exact same technique, performed an ACDF at C4- 5.  Again, I undercut the uncovertebral joints and resected the PLL to truly decompress the left side, which was her symptomatic side.  I placed the same size graft in a similar fashion.  With both grafts in place, I then removed the distraction pins, made sure had hemostasis and then placed the 30-mm length anterior cervical DePuy Skyline plate and affixed it with self-drilling screws, 14 mm in length into the bodies of 4, 5 and 6.  All screws had excellent purchase.  With the final screw tightened down, I then final locked everything according to manufacture's standards.  I irrigated the wound copiously with normal saline and made sure I had hemostasis using bipolar electrocautery.  I returned the esophagus and trachea to the midline and then closed the platysma with interrupted 2-0 Vicryl sutures and a 3-0 Monocryl for the skin.  Steri-Strips and dry dressing were applied.  At the end of the case, all needle and sponge counts were correct.  There were no adverse intraoperative events.  First assistant was [employer] Mayo, my PA.     Azariel Banik D. Shon BatonBrooks, M.D.     DDB/MEDQ  D:  06/08/2015  T:  06/08/2015  Job:  [id number]

## 2015-06-08 NOTE — Evaluation (Signed)
Physical Therapy Evaluation Patient Details Name: Kristina Glover MRN: [id number] DOB: Sep 11, 1960 Today's Date: 06/08/2015   History of Present Illness  Pt is a 55 y/o F s/p C4-6 ACDF.  Pt's PMH includes OCD, restless leg syndrome, fibrocystic breast disease, depression, anxiety, migraines.    Clinical Impression  Patient is s/p above surgery resulting in functional limitations due to the deficits listed below (see PT Problem List). Kristina Glover presents w/ impaired sensation Lt UE and will benefit from thorough OT evaluation.  She completed gait and stair training at supervision level of assist for safety. She will have 24/7 assist/supervision available from her siblings at d/c.  Patient will benefit from skilled PT to increase their independence and safety with mobility to allow discharge to the venue listed below.      Follow Up Recommendations No PT follow up;Supervision for mobility/OOB    Equipment Recommendations  None recommended by PT    Recommendations for Other Services OT consult     Precautions / Restrictions Precautions Precautions: Fall;Cervical Precaution Comments: provided pt w/ and reviewed cervical precaution handout Required Braces or Orthoses: Cervical Brace Cervical Brace: Hard collar Restrictions Weight Bearing Restrictions: No      Mobility  Bed Mobility Overal bed mobility: Needs Assistance Bed Mobility: Sidelying to Sit;Rolling Rolling: Supervision Sidelying to sit: Supervision       General bed mobility comments: Cues for log roll technique, no assist needed  Transfers Overall transfer level: Needs assistance Equipment used: None Transfers: Sit to/from Stand Sit to Stand: Supervision         General transfer comment: Supervision for safety.  From bed, BSC, to chair  Ambulation/Gait Ambulation/Gait assistance: Supervision Ambulation Distance (Feet): 300 Feet Assistive device: None Gait Pattern/deviations: Step-through pattern;Decreased stride  length   Gait velocity interpretation: Below normal speed for age/gender General Gait Details: No assist needed, supervision for safety  Stairs Stairs: Yes Stairs assistance: Supervision Stair Management: One rail Right;Alternating pattern;Forwards Number of Stairs: 6 General stair comments: No cues or physical assist needed, supervision for safety  Wheelchair Mobility    Modified Rankin (Stroke Patients Only)       Balance Overall balance assessment: Needs assistance Sitting-balance support: No upper extremity supported;Feet supported Sitting balance-Leahy Scale: Good     Standing balance support: No upper extremity supported;During functional activity Standing balance-Leahy Scale: Fair                   Standardized Balance Assessment Standardized Balance Assessment : Dynamic Gait Index   Dynamic Gait Index Level Surface: Mild Impairment Gait and Pivot Turn: Normal Steps: Mild Impairment (uses rail)       Pertinent Vitals/Pain Pain Assessment: Faces Faces Pain Scale: Hurts a little bit Pain Location: shoulders due to stress Pain Descriptors / Indicators: Sore Pain Intervention(s): Monitored during session;Limited activity within patient's tolerance;Relaxation    Home Living Family/patient expects to be discharged to:: Private residence Living Arrangements: Alone Available Help at Discharge: Family;Available 24 hours/day Type of Home: House Home Access: Stairs to enter Entrance Stairs-Rails: Lawyer of Steps: 6 Home Layout: One level Home Equipment: None Additional Comments: Sister to stay w/ pt at d/c until the weekend, then her brother will be staying w/ her.    Prior Function Level of Independence: Independent         Comments: Volunteers as a Engineer, technical sales, is a retired Runner, broadcasting/film/video.  Enjoys working in her yard.     Hand Dominance        Extremity/Trunk  Assessment   Upper Extremity Assessment: Defer to OT evaluation;LUE  deficits/detail           Lower Extremity Assessment: Overall WFL for tasks assessed      Cervical / Trunk Assessment: Other exceptions  Communication   Communication: No difficulties  Cognition Arousal/Alertness: Awake/alert Behavior During Therapy: WFL for tasks assessed/performed Overall Cognitive Status: Within Functional Limits for tasks assessed                      General Comments      Exercises General Exercises - Upper Extremity Digit Composite Flexion: AROM;Both;10 reps;Seated General Exercises - Lower Extremity Ankle Circles/Pumps: AROM;Both;10 reps;Seated      Assessment/Plan    PT Assessment Patient needs continued PT services  PT Diagnosis Acute pain   PT Problem List Decreased range of motion;Decreased balance;Decreased knowledge of precautions;Pain;Impaired sensation  PT Treatment Interventions DME instruction;Gait training;Stair training;Functional mobility training;Therapeutic activities;Therapeutic exercise;Balance training;Neuromuscular re-education;Patient/family education;Modalities   PT Goals (Current goals can be found in the Care Plan section) Acute Rehab PT Goals Patient Stated Goal: to go home and get back to working in her yard PT Goal Formulation: With patient/family Time For Goal Achievement: 06/15/15 Potential to Achieve Goals: Good    Frequency Min 3X/week   Barriers to discharge        Co-evaluation               End of Session Equipment Utilized During Treatment: Gait belt;Cervical collar Activity Tolerance: Patient tolerated treatment well Patient left: in chair;with call bell/phone within reach;with family/visitor present Nurse Communication: Mobility status    Functional Assessment Tool Used: Clinical Judgement Functional Limitation: Mobility: Walking and moving around Mobility: Walking and Moving Around Current Status [phone](G8978): At least 1 percent but less than 20 percent impaired, limited or  restricted Mobility: Walking and Moving Around Goal Status [phone](G8979): At least 1 percent but less than 20 percent impaired, limited or restricted    Time: 1600-1626 PT Time Calculation (min) (ACUTE ONLY): 26 min   Charges:   PT Evaluation $PT Eval Low Complexity: 1 Procedure PT Treatments $Gait Training: 8-22 mins   PT G Codes:   PT G-Codes **NOT FOR INPATIENT CLASS** Functional Assessment Tool Used: Clinical Judgement Functional Limitation: Mobility: Walking and moving around Mobility: Walking and Moving Around Current Status ([id number](G8978): At least 1 percent but less than 20 percent impaired, limited or restricted Mobility: Walking and Moving Around Goal Status [phone](G8979): At least 1 percent but less than 20 percent impaired, limited or restricted    Encarnacion ChuAshley Jamiyla Ishee PT, DPT  Pager: [phone] Phone: [phone] 06/08/2015, 4:53 PM

## 2015-06-08 NOTE — Anesthesia Procedure Notes (Signed)
Procedure Name: Intubation Date/Time: 06/08/2015 7:44 AM Performed by: Reine JustFLOWERS, Doyle Kunath T Pre-anesthesia Checklist: Patient identified, Emergency Drugs available, Patient being monitored, Suction available and Timeout performed Patient Re-evaluated:Patient Re-evaluated prior to inductionOxygen Delivery Method: Circle system utilized and Simple face mask Preoxygenation: Pre-oxygenation with 100% oxygen Intubation Type: IV induction Ventilation: Mask ventilation without difficulty Laryngoscope Size: Miller and 2 Grade View: Grade I Tube type: Oral Tube size: 7.5 mm Number of attempts: 1 Airway Equipment and Method: Patient positioned with wedge pillow and Stylet Placement Confirmation: ETT inserted through vocal cords under direct vision,  positive ETCO2 and breath sounds checked- equal and bilateral Secured at: 21 cm Tube secured with: Tape Dental Injury: Teeth and Oropharynx as per pre-operative assessment

## 2015-06-08 NOTE — Anesthesia Postprocedure Evaluation (Signed)
Anesthesia Post Note  Patient: Kristina AlbertsSharon A Glover  Procedure(s) Performed: Procedure(s) (LRB): ANTERIOR CERVICAL DISCECTOMY FUSION C4-C5, C5-C6     (2 LEVELS)  (N/A)  Patient location during evaluation: PACU Anesthesia Type: General Level of consciousness: awake Pain management: pain level controlled Vital Signs Assessment: post-procedure vital signs reviewed and stable Respiratory status: spontaneous breathing Anesthetic complications: no    Last Vitals:  Filed Vitals:   06/08/15 1235 06/08/15 1315  BP:  153/70  Pulse:  95  Temp: 36.6 C 36.5 C  Resp:  20    Last Pain:  Filed Vitals:   06/08/15 1316  PainSc: 0-No pain                 EDWARDS,Brier Firebaugh

## 2015-06-08 NOTE — Transfer of Care (Signed)
Immediate Anesthesia Transfer of Care Note  Patient: Vergia AlbertsSharon A Lightner  Procedure(s) Performed: Procedure(s): ANTERIOR CERVICAL DISCECTOMY FUSION C4-C5, C5-C6     (2 LEVELS)  (N/A)  Patient Location: PACU  Anesthesia Type:General  Level of Consciousness: awake, alert  and oriented  Airway & Oxygen Therapy: Patient Spontanous Breathing and Patient connected to face mask oxygen  Post-op Assessment: Report given to RN, Post -op Vital signs reviewed and stable and Patient moving all extremities X 4  Post vital signs: Reviewed and stable  Last Vitals:  Filed Vitals:   06/08/15 0605 06/08/15 0607  BP: 145/49 136/55  Pulse: 75 82  Temp: 36.8 C   Resp: 20     Last Pain:  Filed Vitals:   06/08/15 1101  PainSc: 3       Patients Stated Pain Goal: 3 (06/08/15 [id number])  Complications: No apparent anesthesia complications

## 2015-06-08 NOTE — H&P (Signed)
History of Present Illness The patient is a 55 year old female who presents to the practice today for a transition into care. The patient is transitioning into care from another physician and a summary of care was reviewed.  Neck pain is described as the following: The patient is seen today in referral from Dr. Penni BombardKendall. The patient reports symptoms involving the left posterior neck which began week(s) ago. The symptoms began without any known injury. Symptoms include neck pain, neck stiffness, shoulder pain, tingling, arm numbness and weakness. The pain radiates to the left trapezius and left shoulder. The patient describes the pain as aching and burning. Onset was gradual. The symptoms occur frequently.The patient describes their symptoms as moderate in severity.The patient does feel that the symptoms are unchanged. Prior to being seen today the patient was previously evaluated by a colleague 2 week(s) ago. Past evaluation has included cervical spine MRI. Past treatment has included non-opioid analgesics (Gabapentin) and opioid analgesics (Hydrocodone).   Problem List/Past Medical Impingement syndrome of left shoulder (M75.42)  Cervical radiculopathy, acute (M54.12)  Pain of left shoulder region (M25.512)  Problems Reconciled   Allergies PrednisoLONE Imodium Multi-Symptom Relief Sulfa Drugs  Levaquin Allergies Reconciled   Social History (Brittany L. 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 No history of drug/alcohol rehab  Not under pain contract  Number of flights of stairs before winded  1 Tobacco / smoke exposure  03/11/2015: no Tobacco use  Former smoker. 03/11/2015: smoke(d) 3 more  pack(s) per day  Medication History  Amphetamine-Dextroamphet ER (20MG  Capsule ER 24HR, Oral) Active. BuPROPion HCl ER (XL) (150MG  Tablet ER 24HR, Oral) Active. Furosemide (20MG  Tablet, Oral) Active. Gabapentin (300MG  Capsule, 1 (one) Capsule Oral po qhs, Taken starting 05/13/2015) Active. Glycopyrrolate (2MG  Tablet, Oral) Active. Losartan Potassium (100MG  Tablet, Oral) Active. Metoprolol Succinate ER (50MG  Tablet ER 24HR, Oral) Active. Naproxen Sodium (550MG  Tablet, Oral) Active. Nitrofurantoin Monohyd Macro (100MG  Capsule, Oral) Active. Sertraline HCl (100MG  Tablet, Oral) Active. TiZANidine HCl (2MG  Tablet, Oral) Active. TraMADol HCl (50MG  Tablet, Oral) Active. Zostavax (19400UNT/0.65ML For Solution, Subcutaneous) Active. Medications Reconciled    Physical Exam  General General Appearance-Not in acute distress. Orientation-Oriented X3. Build & Nutrition-Well nourished and Well developed.  Integumentary General Characteristics Surgical Scars - no surgical scar evidence of previous cervical surgery. Cervical Spine-Skin examination of the cervical spine is without deformity, skin lesions, lacerations or abrasions.  Peripheral Vascular Upper Extremity Palpation - Radial pulse - Bilateral - 2+.  Neurologic Sensation Upper Extremity - Left - sensation is diminished in the upper extremity. Reflexes Biceps Reflex - Bilateral - 2+. Brachioradialis Reflex - Bilateral - 2+. Triceps Reflex - Bilateral - 2+. Hoffman's Sign - Bilateral - Hoffman's sign not present.  Musculoskeletal Spine/Ribs/Pelvis  Cervical Spine : Inspection and Palpation - Tenderness - right cervical paraspinals tender to palpation, left cervical paraspinals tender to palpation and left trapezius tender to palpation. Strength and Tone: Strength: Strength: Strength - Deltoid - Bilateral - 5/5. Right - 5/5. Biceps - Left - 3-/5. Triceps - Bilateral - 5/5. Wrist Extension - Left - 3-/5. Right  - 5/5. Hand Grip - Bilateral - 5/5. Heel walk - Bilateral - able to heel walk without difficulty. Toe Walk - Bilateral - able to  walk on toes without difficulty. Heel-Toe Walk - Bilateral - able to heel-toe walk without difficulty. ROM - Flexion - Mildly decreased and painful. Extension - Mildly decreased and painful. Left Lateral Flexion - Mildly decreased and painful. Right Lateral Flexion - Mildly decreased and painful. Left Rotation - Mildly decreased and painful. Right Rotation - Mildly decreased and painful. Pain - . Cervical Spine - Special Testing - axial compression test negative, cross chest impingement test negative. Non-Anatomic Signs - No non-anatomic signs present. Upper Extremity Range of Motion - No truesholder pain with IR/ER of the shoulders.    Objective Transcription(Dara Camargo D. Shon Baton, MD; 05/24/2015 4:39 PM) ADDENDUM On clinical exam, she has C6 dysesthesias on the left side. She has 4/5 biceps and wrist extensor strength on the left side and some trace deltoid weakness on the left side. Dysesthesias also into the C5 distribution. Negative Babinski test. Negative Hoffmann sign, and she has significant neck pain.  RADIOGRAPHS MRI confirms disc degeneration with moderate-to-significant left neural foraminal narrowing at C4-C5 and at C5-C6, there is a herniated disc causing significant left lateral recess and foraminal compromise and C6 neural compression. Minimal disc protrusions and bulges at the remaining levels. No cord signal changes.   Assessment & Plan  Anterior cervical fusion:Risks of surgery include, but are not limited to: Throat pain, swallowing difficulty, hoarseness or change in voice, death, stroke, paralysis, nerve root damage/injury, bleeding, blood clots, loss of bowel/bladder control, hardware failure, or mal-position, spinal fluid leak, adjacent segment disease, non-union, need for further surgery, ongoing or worse pain, infection. Post-operative bleeding or  swelling that could require emergent surgery. Goal Of Surgery: Discussed that goal of surgery is to reduce pain and improve function and quality of life. Patient is aware that despite all appropriate treatment that there pain and function could be the same, worse, or different. 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 However, due to the underlying disease at the C4-C5 level and some trace C5 nerve compression symptoms, I would recommend addressing both levels. We have had a long talk about injection therapy and physical therapy and given the severity of her pain, she wants to move forward with surgery. We reviewed the risks and benefits of surgery and the goals of surgery. All of her questions were addressed. I have given her some information to review. We will get preoperative medical clearance because of her underlying sleep apnea, and we will also use an external bone stimulator for 6 to 9 months after surgery to improve her overall fusion rates.

## 2015-06-08 NOTE — Progress Notes (Signed)
Dr Leta JunglingEwell informed of pt states she will be sick if she takes a pill, and that she has not had her Toprol this am, He states we can give it to her IV in the OR. CRNA informed.

## 2015-06-08 NOTE — Progress Notes (Signed)
Pt did not take her betablocker this am, states if she takes it, it will make her sick.. Order placed for toprol xl 50mg .

## 2015-06-08 NOTE — Discharge Instructions (Signed)

## 2015-06-09 ENCOUNTER — Encounter (HOSPITAL_COMMUNITY): Payer: Self-pay | Admitting: Orthopedic Surgery

## 2015-06-09 DIAGNOSIS — M50122 Cervical disc disorder at C5-C6 level with radiculopathy: Secondary | ICD-10-CM | POA: Diagnosis not present

## 2015-06-09 NOTE — Progress Notes (Signed)
Set pt up on cpap pt brought her own tubing and nasal pilows from home.  Put pt on 7.5 CM H20 tolerating well

## 2015-06-09 NOTE — Evaluation (Signed)
Occupational Therapy Evaluation Patient Details Name: Kristina Glover MRN: [id number] DOB: 1960-08-05 Today's Date: 06/09/2015    History of Present Illness Pt is a 55 y/o F s/p C4-6 ACDF.  Pt's PMH includes OCD, restless leg syndrome, fibrocystic breast disease, depression, anxiety, migraines.   Clinical Impression   Patient evaluated by Occupational Therapy with no further acute OT needs identified. All education has been completed and the patient has no further questions. See below for any follow-up Occupational Therapy or equipment needs. OT to sign off. Thank you for referral.      Follow Up Recommendations  No OT follow up    Equipment Recommendations  None recommended by OT    Recommendations for Other Services       Precautions / Restrictions Precautions Precautions: Fall;Cervical Precaution Comments: Reviewed cervical precaution handout and pt was cued for precautions during functional mobility.  Required Braces or Orthoses: Cervical Brace Cervical Brace: Hard collar Restrictions Weight Bearing Restrictions: No      Mobility Bed Mobility               General bed mobility comments: Pt sitting up on EOB when OT arrived.   Transfers Overall transfer level: Modified independent Equipment used: None Transfers: Sit to/from Stand           General transfer comment: No assist required. No unsteadiness or LOB noted.     Balance     Sitting balance-Leahy Scale: Good       Standing balance-Leahy Scale: Fair                              ADL Overall ADL's : At baseline                                       General ADL Comments: pt educated on don doff brace, changing of brace, and precautions with bathing. pt able to complet oral care and bed mobiltiy this session     Vision     Perception     Praxis      Pertinent Vitals/Pain Faces Pain Scale: Hurts a little bit     Hand Dominance     Extremity/Trunk  Assessment Upper Extremity Assessment Upper Extremity Assessment: Generalized weakness   Lower Extremity Assessment Lower Extremity Assessment: Defer to PT evaluation   Cervical / Trunk Assessment Cervical / Trunk Assessment: Other exceptions Cervical / Trunk Exceptions: s/p acdf   Communication Communication Communication: No difficulties   Cognition Arousal/Alertness: Awake/alert Behavior During Therapy: WFL for tasks assessed/performed Overall Cognitive Status: Within Functional Limits for tasks assessed                     General Comments       Exercises       Shoulder Instructions      Home Living Family/patient expects to be discharged to:: Private residence Living Arrangements: Alone Available Help at Discharge: Family;Available 24 hours/day Type of Home: House Home Access: Stairs to enter [employer] of Steps: 6 Entrance Stairs-Rails: Left;Right Home Layout: One level     Bathroom Shower/Tub: Tub/shower unit;Walk-in shower   Bathroom Toilet: Standard     Home Equipment: None   Additional Comments: Sister to stay w/ pt at d/c until the weekend, then her brother will be staying w/ her.      Prior  Functioning/Environment Level of Independence: Independent        Comments: Volunteers as a Engineer, technical salestutor, is a retired Runner, broadcasting/film/videoteacher.  Enjoys working in her yard.    OT Diagnosis:     OT Problem List:     OT Treatment/Interventions:      OT Goals(Current goals can be found in the care plan section) Acute Rehab OT Goals Patient Stated Goal: to go home and get back to working in her yard  OT Frequency:     Barriers to D/C:            Co-evaluation              End of Session Equipment Utilized During Treatment: Gait belt;Cervical collar Nurse Communication: Mobility status;Precautions  Activity Tolerance: Patient tolerated treatment well Patient left: in bed;with call bell/phone within reach   Time: 0755-0810 OT Time Calculation  (min): 15 min Charges:  OT General Charges $OT Visit: 1 Procedure OT Evaluation $OT Eval Moderate Complexity: 1 Procedure G-Codes:    Boone MasterJones, Kristina Glover 06/09/2015, 1:17 PM  Mateo FlowJones, Kristina Glover   OTR/L Pager: [phone]: [phone] Office: [phone] .

## 2015-06-09 NOTE — Progress Notes (Signed)
Pt doing well. Pt and family given D/C instructions with Rx's, verbal understanding was provided. Pt's incision is covered with dressing and is clean and dry. Pt's IV was removed prior to D/C. Pt D/C'd home via wheelchair @ 1115 per MD order. Pt is stable @ D/C and has no other needs at this time. Rema FendtAshley Finnian Husted, RN

## 2015-06-09 NOTE — Progress Notes (Signed)
Physical Therapy Treatment and Discharge Patient Details Name: Kristina Glover MRN: [id number] DOB: 1960/12/29 Today's Date: 06/09/2015    History of Present Illness Pt is a 55 y/o F s/p C4-6 ACDF.  Pt's PMH includes OCD, restless leg syndrome, fibrocystic breast disease, depression, anxiety, migraines.    PT Comments    Pt progressing well with functional mobility. She is currently functioning at a modified independent level and is anticipating d/c home today. Car transfer, stair negotiation, and general safety/precautions reviewed. Will continue to follow and progress as able per POC.   Follow Up Recommendations  No PT follow up;Supervision for mobility/OOB     Equipment Recommendations  None recommended by PT    Recommendations for Other Services OT consult     Precautions / Restrictions Precautions Precautions: Fall;Cervical Precaution Comments: Reviewed cervical precaution handout and pt was cued for precautions during functional mobility.  Required Braces or Orthoses: Cervical Brace Cervical Brace: Hard collar Restrictions Weight Bearing Restrictions: No    Mobility  Bed Mobility               General bed mobility comments: Pt sitting up on EOB when PT arrived.   Transfers Overall transfer level: Modified independent Equipment used: None Transfers: Sit to/from Stand           General transfer comment: No assist required. No unsteadiness or LOB noted.   Ambulation/Gait Ambulation/Gait assistance: Modified independent (Device/Increase time) Ambulation Distance (Feet): 400 Feet Assistive device: None Gait Pattern/deviations: Step-through pattern;Decreased stride length Gait velocity: Decreased Gait velocity interpretation: Below normal speed for age/gender General Gait Details: Good posture overall. Pt moving quickly without any noted unsteadiness. Noted that pt reaching for light touch on wall for stability at uneven surface in hallway.     Stairs Stairs: Yes Stairs assistance: Modified independent (Device/Increase time) Stair Management: One rail Right;Step to pattern;Forwards Number of Stairs: 10 General stair comments: No cues or physical assist needed.  Wheelchair Mobility    Modified Rankin (Stroke Patients Only)       Balance Overall balance assessment: Needs assistance Sitting-balance support: Feet supported;No upper extremity supported Sitting balance-Leahy Scale: Good     Standing balance support: No upper extremity supported;During functional activity Standing balance-Leahy Scale: Fair                      Cognition Arousal/Alertness: Awake/alert Behavior During Therapy: WFL for tasks assessed/performed Overall Cognitive Status: Within Functional Limits for tasks assessed                      Exercises      General Comments        Pertinent Vitals/Pain Pain Assessment: Faces Faces Pain Scale: Hurts a little bit Pain Location: Incision Pain Descriptors / Indicators: Operative site guarding;Sore Pain Intervention(s): Limited activity within patient's tolerance;Monitored during session;Repositioned    Home Living                      Prior Function            PT Goals (current goals can now be found in the care plan section) Acute Rehab PT Goals Patient Stated Goal: to go home and get back to working in her yard PT Goal Formulation: With patient/family Time For Goal Achievement: 06/15/15 Potential to Achieve Goals: Good Progress towards PT goals: Progressing toward goals    Frequency  Min 3X/week    PT Plan Current plan remains appropriate  Co-evaluation             End of Session Equipment Utilized During Treatment: Cervical collar Activity Tolerance: Patient tolerated treatment well Patient left: in bed;with call bell/phone within reach (Sitting EOB)     Time: 1610-9604 PT Time Calculation (min) (ACUTE ONLY): 10 min  Charges:   $Gait Training: 8-22 mins                    G Codes:      Conni Slipper 06/25/2015, 8:28 AM   Conni Slipper, PT, DPT Acute Rehabilitation Services Pager: [phone]

## 2015-06-09 NOTE — Progress Notes (Signed)
    Subjective: Procedure(s) (LRB): ANTERIOR CERVICAL DISCECTOMY FUSION C4-C5, C5-C6     (2 LEVELS)  (N/A) 1 Day Post-Op  Patient reports pain as 2 on 0-10 scale.  Reports decreased arm pain reports incisional neck pain   Positive void Negative bowel movement Positive flatus Negative chest pain or shortness of breath  Objective: Vital signs in last 24 hours: Temp:  [97.7 F (36.5 C)-98.7 F (37.1 C)] 98 F (36.7 C) (05/05 0413) Pulse Rate:  [85-110] 90 (05/05 0413) Resp:  [14-29] 20 (05/05 0413) BP: (103-153)/(52-92) 136/60 mmHg (05/05 0413) SpO2:  [92 %-97 %] 96 % (05/05 0413)  Intake/Output from previous day: 05/04 0701 - 05/05 0700 In: 1740 [P.O.:240; I.V.:1500] Out: 50 [Blood:50]  Labs: No results for input(s): WBC, RBC, HCT, PLT in the last 72 hours. No results for input(s): NA, K, CL, CO2, BUN, CREATININE, GLUCOSE, CALCIUM in the last 72 hours. No results for input(s): LABPT, INR in the last 72 hours.  Physical Exam: Neurologically intact ABD soft Intact pulses distally Incision: dressing C/D/I, no drainage and no swelling noted Compartment soft  Assessment/Plan: Patient stable  xrays n/a Mobilization with physical therapy Encourage incentive spirometry Continue care  Advance diet Up with therapy  Ok for d/c today  Venita Lickahari Anthonyjames Bargar, MD [facility] [phone]

## 2015-06-28 NOTE — Discharge Summary (Signed)
Physician Discharge Summary  Patient ID: Kristina Glover MRN: [id number] DOB/AGE: 56-Dec-1962 56 y.o.  Admit date: 06/08/2015 Discharge date: 06/28/2015  Admission Diagnoses:  Cervical DDD  Discharge Diagnoses:  Active Problems:   Neck pain   Past Medical History  Diagnosis Date  . Hypertension   . RLS (restless legs syndrome)   . Fibrocystic breast disease   . Depression   . PONV (postoperative nausea and vomiting)   . OSA (obstructive sleep apnea)     CPAP   USES AT NIGHT  . Anxiety   . Headache     MIGRAINES    . Arthritis     Surgeries: Procedure(s): ANTERIOR CERVICAL DISCECTOMY FUSION C4-C5, C5-C6     (2 LEVELS)  on 06/08/2015   Consultants (if any):    Discharged Condition: Improved  Hospital Course: Kristina Glover is an 55 y.o. female who was admitted 06/08/2015 with a diagnosis of Cervical DDD and radicular arm pain and went to the operating room on 06/08/2015 and underwent the above named procedures.   Pt was discharged on 06/09/15. She was given perioperative antibiotics:  Anti-infectives    Start     Dose/Rate Route Frequency Ordered Stop   06/08/15 1600  ceFAZolin (ANCEF) IVPB 1 g/50 mL premix     1 g 100 mL/hr over 30 Minutes Intravenous Every 8 hours 06/08/15 1258 06/08/15 2323   06/08/15 0600  ceFAZolin (ANCEF) IVPB 2g/100 mL premix     2 g 200 mL/hr over 30 Minutes Intravenous 30 min pre-op 06/08/15 0600 06/08/15 0755    .  She was given sequential compression devices, early ambulation, and TED for DVT prophylaxis.  She benefited maximally from the hospital stay and there were no complications.    Recent vital signs:  Filed Vitals:   06/09/15 0413 06/09/15 0830  BP: 136/60 129/79  Glover: 90 87  Temp: 98 F (36.7 C) 98.4 F (36.9 C)  Resp: 20 20    Recent laboratory studies:  Lab Results  Component Value Date   HGB 13.2 06/02/2015   HGB 15.4* 11/03/2011   Lab Results  Component Value Date   WBC 6.5 06/02/2015   PLT 209 06/02/2015   No  results found for: INR Lab Results  Component Value Date   NA 142 06/02/2015   K 3.6 06/02/2015   CL 108 06/02/2015   CO2 24 06/02/2015   BUN 10 06/02/2015   CREATININE 0.77 06/02/2015   GLUCOSE 150* 06/02/2015    Discharge Medications:     Medication List    STOP taking these medications        aspirin 81 MG chewable tablet     gabapentin 300 MG capsule  Commonly known as:  NEURONTIN      TAKE these medications        amphetamine-dextroamphetamine 20 MG 24 hr capsule  Commonly known as:  ADDERALL XR  Take 20 mg by mouth daily.     B-12 COMPLIANCE INJECTION 1000 MCG/ML Kit  Generic drug:  Cyanocobalamin  Inject 1,000 mcg as directed every 30 (thirty) days.     buPROPion 150 MG 24 hr tablet  Commonly known as:  WELLBUTRIN XL  Take 150 mg by mouth daily.     furosemide 20 MG tablet  Commonly known as:  LASIX  Take 20 mg by mouth daily as needed for fluid.     HM VITAMIN D3 4000 units Caps  Generic drug:  Cholecalciferol  Take 4,000 Units by mouth daily.  losartan-hydrochlorothiazide 100-12.5 MG tablet  Commonly known as:  HYZAAR  Take 1 tablet by mouth daily.     methocarbamol 500 MG tablet  Commonly known as:  ROBAXIN  Take 1 tablet (500 mg total) by mouth 3 (three) times daily as needed for muscle spasms.     metoprolol succinate 50 MG 24 hr tablet  Commonly known as:  TOPROL-XL  Take 50 mg by mouth daily.     mometasone 50 MCG/ACT nasal spray  Commonly known as:  NASONEX  Place 1 spray into the nose daily.     ondansetron 4 MG tablet  Commonly known as:  ZOFRAN  Take 1 tablet (4 mg total) by mouth every 8 (eight) hours as needed for nausea or vomiting.     oxyCODONE-acetaminophen 10-325 MG tablet  Commonly known as:  PERCOCET  Take 1 tablet by mouth every 4 (four) hours as needed for pain.     sertraline 100 MG tablet  Commonly known as:  ZOLOFT  Take 200 mg by mouth daily.        Diagnostic Studies: Dg Cervical Spine 2-3  Views  06/08/2015  CLINICAL DATA:  Cervical fusion EXAM: DG C-ARM 61-120 MIN; CERVICAL SPINE - 2-3 VIEW COMPARISON:  None. FLUOROSCOPY TIME:  Radiation Exposure Index (as provided by the fluoroscopic device): Not available If the device does not provide the exposure index: Fluoroscopy Time:  14 seconds Number of Acquired Images:  3 FINDINGS: Changes of cervical fusion at C4-5 and C5-6 are noted. Anterior fixation is noted. No acute abnormality is seen. IMPRESSION: Cervical fusion at C4-5 and C5-6. Electronically Signed   By: Inez Catalina M.D.   On: 06/08/2015 10:55   Dg C-arm 61-120 Min  06/08/2015  CLINICAL DATA:  Cervical fusion EXAM: DG C-ARM 61-120 MIN; CERVICAL SPINE - 2-3 VIEW COMPARISON:  None. FLUOROSCOPY TIME:  Radiation Exposure Index (as provided by the fluoroscopic device): Not available If the device does not provide the exposure index: Fluoroscopy Time:  14 seconds Number of Acquired Images:  3 FINDINGS: Changes of cervical fusion at C4-5 and C5-6 are noted. Anterior fixation is noted. No acute abnormality is seen. IMPRESSION: Cervical fusion at C4-5 and C5-6. Electronically Signed   By: Inez Catalina M.D.   On: 06/08/2015 10:55    Disposition: 01-Home or Self Care        Follow-up Information    Follow up with Dahlia Bailiff, MD. Schedule an appointment as soon as possible for a visit in 2 weeks.   Specialty:  Orthopedic Surgery   Why:  If symptoms worsen, For suture removal, For wound re-check   Contact information:   [address] [place] [address] [phone]        Signed: Valinda Hoar 06/28/2015, 8:07 AM

## 2015-08-30 ENCOUNTER — Ambulatory Visit: Payer: BC Managed Care – PPO | Admitting: Adult Health

## 2015-09-04 ENCOUNTER — Ambulatory Visit (INDEPENDENT_AMBULATORY_CARE_PROVIDER_SITE_OTHER): Payer: BC Managed Care – PPO | Admitting: Adult Health

## 2015-09-04 ENCOUNTER — Encounter: Payer: Self-pay | Admitting: Adult Health

## 2015-09-04 DIAGNOSIS — G4733 Obstructive sleep apnea (adult) (pediatric): Secondary | ICD-10-CM | POA: Diagnosis not present

## 2015-09-04 NOTE — Progress Notes (Signed)
Subjective:    Patient ID: Kristina Glover, female    DOB: 1960/03/01, 55 y.o.   MRN: [id number]  HPI 55 year old female followed for sleep apnea  She underwent tonsillectomy in 2014 , and this helped her tremendously. She takes adderall for ADHD She smoked 1 PPD x 35 y  TEST  NPSG 2006:  AHI 11/hr   09/04/2015 follow-up sleep apnea Patient returns for one-year follow-up for sleep apnea.  Use CPAP on average 6 hours nightly, mask fits well.  She denies any significant daytime sleepiness Download shows excellent compliance with average usage at 7.5 hours. She is on a set pressure of 12 cm of H2O. AHI 1.9. Minimum leaks. She denies any chest pain, orthopnea, PND, or increased leg swelling  Had neck surgery in May , doing very well.    Past Medical History:  Diagnosis Date  . Anxiety   . Arthritis   . Depression   . Fibrocystic breast disease   . Headache    MIGRAINES    . Hypertension   . OSA (obstructive sleep apnea)    CPAP   USES AT NIGHT  . PONV (postoperative nausea and vomiting)   . RLS (restless legs syndrome)    Current Outpatient Prescriptions on File Prior to Visit  Medication Sig Dispense Refill  . buPROPion (WELLBUTRIN XL) 150 MG 24 hr tablet Take 150 mg by mouth daily.    . Cholecalciferol (HM VITAMIN D3) 4000 units CAPS Take 4,000 Units by mouth daily.    . Cyanocobalamin (B-12 COMPLIANCE INJECTION) 1000 MCG/ML KIT Inject 1,000 mcg as directed every 30 (thirty) days.    . furosemide (LASIX) 20 MG tablet Take 20 mg by mouth daily as needed for fluid.     Marland Kitchen losartan-hydrochlorothiazide (HYZAAR) 100-12.5 MG tablet Take 1 tablet by mouth daily.    . metoprolol succinate (TOPROL-XL) 50 MG 24 hr tablet Take 50 mg by mouth daily.     . mometasone (NASONEX) 50 MCG/ACT nasal spray Place 1 spray into the nose daily.     . sertraline (ZOLOFT) 100 MG tablet Take 200 mg by mouth daily.     Marland Kitchen amphetamine-dextroamphetamine (ADDERALL XR) 20 MG 24 hr capsule Take 20 mg by  mouth daily.    . methocarbamol (ROBAXIN) 500 MG tablet Take 1 tablet (500 mg total) by mouth 3 (three) times daily as needed for muscle spasms. (Patient not taking: Reported on 09/04/2015) 60 tablet 0   No current facility-administered medications on file prior to visit.       Review of Systems Constitutional:   No  weight loss, night sweats,  Fevers, chills, fatigue, or  lassitude.  HEENT:   No headaches,  Difficulty swallowing,  Tooth/dental problems, or  Sore throat,                No sneezing, itching, ear ache, nasal congestion, post nasal drip,   CV:  No chest pain,  Orthopnea, PND, swelling in lower extremities, anasarca, dizziness, palpitations, syncope.   GI  No heartburn, indigestion, abdominal pain, nausea, vomiting, diarrhea, change in bowel habits, loss of appetite, bloody stools.   Resp: No shortness of breath with exertion or at rest.  No excess mucus, no productive cough,  No non-productive cough,  No coughing up of blood.  No change in color of mucus.  No wheezing.  No chest wall deformity  Skin: no rash or lesions.  GU: no dysuria, change in color of urine, no urgency or frequency.  No flank pain, no hematuria   MS:  No joint pain or swelling.  No decreased range of motion.  No back pain.  Psych:  No change in mood or affect. No depression or anxiety.  No memory loss.         Objective:   Physical Exam  Vitals:   09/04/15 1032  BP: 128/88  Pulse: 83  Temp: 98.3 F (36.8 C)  TempSrc: Oral  SpO2: 95%  Weight: 231 lb (104.8 kg)  Height: 5' 4"  (1.626 m)  Body mass index is 39.65 kg/m.   GEN: A/Ox3; pleasant , NAD, well nourished    HEENT:  [place]/AT,  EACs-clear, TMs-wnl, NOSE-clear, THROAT-clear, no lesions, no postnasal drip or exudate noted. Class 2-3 MP airway   NECK:  Supple w/ fair ROM; no JVD; normal carotid impulses w/o bruits; no thyromegaly or nodules palpated; no lymphadenopathy.    RESP  Clear  P & A; w/o, wheezes/ rales/ or rhonchi. no  accessory muscle use, no dullness to percussion  CARD:  RRR, no m/r/g  , no peripheral edema, pulses intact, no cyanosis or clubbing.  GI:   Soft & nt; nml bowel sounds; no organomegaly or masses detected.   Musco: Warm bil, no deformities or joint swelling noted.   Neuro: alert, no focal deficits noted.    Skin: Warm, no lesions or rashes  Radhika Dershem NP-C  [facility]  09/04/2015       Assessment & Plan:

## 2015-09-04 NOTE — Patient Instructions (Signed)
Continue on C Pap at bedtime. Continue to work on weight loss Do not drive if sleepy Follow-up in one year with Dr. Vassie Loll and as needed

## 2015-09-04 NOTE — Assessment & Plan Note (Signed)
Compensated on CPAP   Plan  Continue on C Pap at bedtime. Continue to work on weight loss Do not drive if sleepy Follow-up in one year with Dr. Vassie Loll and as needed

## 2015-09-18 ENCOUNTER — Encounter: Payer: Self-pay | Admitting: Adult Health

## 2016-11-28 DIAGNOSIS — F900 Attention-deficit hyperactivity disorder, predominantly inattentive type: Secondary | ICD-10-CM | POA: Insufficient documentation

## 2017-11-05 ENCOUNTER — Other Ambulatory Visit: Payer: Self-pay | Admitting: Family Medicine

## 2017-11-05 DIAGNOSIS — Z1239 Encounter for other screening for malignant neoplasm of breast: Secondary | ICD-10-CM

## 2017-12-01 ENCOUNTER — Other Ambulatory Visit: Payer: Self-pay | Admitting: Family Medicine

## 2017-12-01 DIAGNOSIS — Z1231 Encounter for screening mammogram for malignant neoplasm of breast: Secondary | ICD-10-CM

## 2018-01-12 ENCOUNTER — Ambulatory Visit: Payer: BC Managed Care – PPO

## 2018-01-12 ENCOUNTER — Ambulatory Visit
Admission: RE | Admit: 2018-01-12 | Discharge: 2018-01-12 | Disposition: A | Discharge status: Home / Self Care | Payer: BC Managed Care – PPO | Source: Ambulatory Visit | Attending: Family Medicine | Admitting: Family Medicine

## 2018-01-12 DIAGNOSIS — Z1231 Encounter for screening mammogram for malignant neoplasm of breast: Secondary | ICD-10-CM

## 2019-01-11 ENCOUNTER — Inpatient Hospital Stay: Admit: 2019-01-11 | Payer: BC Managed Care – PPO | Admitting: Orthopedic Surgery

## 2019-02-22 SURGERY — ARTHROPLASTY, KNEE, TOTAL
Anesthesia: Choice | Site: Knee | Laterality: Right

## 2019-03-15 ENCOUNTER — Inpatient Hospital Stay: Admit: 2019-03-15 | Payer: BC Managed Care – PPO | Admitting: Orthopedic Surgery

## 2019-03-15 SURGERY — ARTHROPLASTY, KNEE, TOTAL
Anesthesia: Choice | Site: Knee | Laterality: Right

## 2020-02-07 DIAGNOSIS — M7730 Calcaneal spur, unspecified foot: Secondary | ICD-10-CM | POA: Insufficient documentation

## 2020-02-28 DIAGNOSIS — M7660 Achilles tendinitis, unspecified leg: Secondary | ICD-10-CM | POA: Insufficient documentation

## 2020-03-09 DIAGNOSIS — M79672 Pain in left foot: Secondary | ICD-10-CM | POA: Insufficient documentation

## 2020-05-01 DIAGNOSIS — M7662 Achilles tendinitis, left leg: Secondary | ICD-10-CM | POA: Insufficient documentation

## 2020-05-13 ENCOUNTER — Telehealth: Payer: BC Managed Care – PPO | Admitting: Physician Assistant

## 2020-05-13 ENCOUNTER — Encounter: Payer: Self-pay | Admitting: Physician Assistant

## 2020-05-13 DIAGNOSIS — J018 Other acute sinusitis: Secondary | ICD-10-CM

## 2020-05-13 MED ORDER — AMOXICILLIN-POT CLAVULANATE 875-125 MG PO TABS: 1.0000 | ORAL_TABLET | Freq: Two times a day (BID) | ORAL | 0 refills | Status: DC

## 2020-05-13 NOTE — Progress Notes (Signed)
Virtual Visit via Video Note  I connected with Kristina Glover on 05/13/20 at  1:00 PM EDT by a video enabled telemedicine application and verified that I am speaking with the correct person using two identifiers.  Location: Patient: patient's home     Provider: provider's office  Person participating in the virtual visit: patient and provider    I discussed the limitations of evaluation and management by telemedicine and the availability of in person appointments. The patient expressed understanding and agreed to proceed.   I discussed the assessment and treatment plan with the patient. The patient was provided an opportunity to ask questions and all were answered. The patient agreed with the plan and demonstrated an understanding of the instructions.   The patient was advised to call back or seek an in-person evaluation if the symptoms worsen or if the condition fails to improve as anticipated.  I provided 15 minutes of non-face-to-face time during this encounter.   Waldon Merl, PA-C  Acute Office Visit  Subjective:    Patient ID: RIKA Glover, female    DOB: 1960/11/15, 60 y.o.   MRN: [id number]  No chief complaint on file.   60 yo F in NAD complains of possible sinus infection- has had runny nose, sneezing, sinus pressure/congestion, green rhinorrhea x 8 days. States has seasonal allergies but does not take meds, as she is unable to tolerate oral antihistamines.. Has taken Mucinex without relief of symptoms. Denies any fever, chills, sweats, headache, body aches,  cp, cough, dyspnea, wheezing, changes to her taste or smell, rash. Denies any known contact with someone with Covid 19/flu/ or strep.   Patient is in today for "sinus infection"  Past Medical History:  Diagnosis Date  . Anxiety   . Arthritis   . Depression   . Fibrocystic breast disease   . Headache    MIGRAINES    . Hypertension   . OSA (obstructive sleep apnea)    CPAP   USES AT NIGHT  . PONV (postoperative  nausea and vomiting)   . RLS (restless legs syndrome)     Past Surgical History:  Procedure Laterality Date  . ANTERIOR CERVICAL DECOMP/DISCECTOMY FUSION N/A 06/08/2015   Procedure: ANTERIOR CERVICAL DISCECTOMY FUSION C4-C5, C5-C6     (2 LEVELS) ;  Surgeon: Melina Schools, MD;  Location: [facility];  Service: Orthopedics;  Laterality: N/A;  . cyst removed     on hand x 2  . scar tissue removed     right foot  . SHOULDER SURGERY     right  . TOTAL ABDOMINAL HYSTERECTOMY      Family History  Problem Relation Age of Onset  . Emphysema Father   . Cancer Father        unsure what kind  . Allergies Sister   . Heart disease Mother   . Heart disease Sister   . Breast cancer Neg Hx     Social History   Socioeconomic History  . Marital status: Divorced    Spouse name: Not on file  . Number of children: Not on file  . Years of education: Not on file  . Highest education level: Not on file  Occupational History  . Occupation: 3rd grade teacher  Tobacco Use  . Smoking status: Former Smoker    Packs/day: 1.00    Years: 35.00    Pack years: 35.00    Types: Cigarettes    Quit date: 08/08/2012    Years since quitting: 7.7  .  Smokeless tobacco: Not on file  Substance and Sexual Activity  . Alcohol use: No  . Drug use: No  . Sexual activity: Not on file  Other Topics Concern  . Not on file  Social History Narrative  . Not on file   Social Determinants of Health   Financial Resource Strain: Not on file  Food Insecurity: Not on file  Transportation Needs: Not on file  Physical Activity: Not on file  Stress: Not on file  Social Connections: Not on file  Intimate Partner Violence: Not on file    Outpatient Medications Prior to Visit  Medication Sig Dispense Refill  . amphetamine-dextroamphetamine (ADDERALL XR) 20 MG 24 hr capsule Take 20 mg by mouth daily.    Marland Kitchen aspirin 81 MG chewable tablet Chew 81 mg by mouth daily.    Marland Kitchen buPROPion (WELLBUTRIN XL) 150 MG 24 hr tablet Take 150 mg  by mouth daily.    . Cholecalciferol (HM VITAMIN D3) 4000 units CAPS Take 4,000 Units by mouth daily.    . Cyanocobalamin (B-12 COMPLIANCE INJECTION) 1000 MCG/ML KIT Inject 1,000 mcg as directed every 30 (thirty) days.    . furosemide (LASIX) 20 MG tablet Take 20 mg by mouth daily as needed for fluid.     Marland Kitchen losartan-hydrochlorothiazide (HYZAAR) 100-12.5 MG tablet Take 1 tablet by mouth daily.    . methocarbamol (ROBAXIN) 500 MG tablet Take 1 tablet (500 mg total) by mouth 3 (three) times daily as needed for muscle spasms. (Patient not taking: Reported on 09/04/2015) 60 tablet 0  . metoprolol succinate (TOPROL-XL) 50 MG 24 hr tablet Take 50 mg by mouth daily.     . mometasone (NASONEX) 50 MCG/ACT nasal spray Place 1 spray into the nose daily.     . sertraline (ZOLOFT) 100 MG tablet Take 200 mg by mouth daily.      No facility-administered medications prior to visit.    Allergies  Allergen Reactions  . Adhesive [Tape] Other (See Comments)    Blisters  . Imodium [Loperamide] Hives  . Levofloxacin Other (See Comments)    Unknown  . Sulfonamide Derivatives Other (See Comments)    Dehydration  . Diphenhydramine Hcl Palpitations    Heart race    Review of Systems  Constitutional: Negative for activity change, appetite change, chills, diaphoresis, fatigue and fever.  HENT: Positive for rhinorrhea, sinus pressure and sneezing. Negative for congestion, sinus pain, sore throat and trouble swallowing.   Respiratory: Negative for cough, chest tightness, shortness of breath and wheezing.   Cardiovascular: Negative for chest pain.  Gastrointestinal: Negative for abdominal pain, nausea and vomiting.  Musculoskeletal: Negative for myalgias.  Skin: Negative for rash.  Neurological: Negative for headaches.  Psychiatric/Behavioral: Negative for agitation, behavioral problems and confusion.       Objective:    Physical Exam Constitutional:      General: She is not in acute distress.     Appearance: Normal appearance. She is not ill-appearing, toxic-appearing or diaphoretic.  HENT:     Head: Normocephalic and atraumatic.     Nose:     Right Sinus: Maxillary sinus tenderness present.     Left Sinus: Maxillary sinus tenderness present.     Comments: Bilateral maxillary sinus tenderness when palpated by patient on camera during video visit Eyes:     General: No scleral icterus. Pulmonary:     Effort: Pulmonary effort is normal.  Skin:    Coloration: Skin is not pale.  Neurological:     Mental Status:  She is alert and oriented to person, place, and time.  Psychiatric:        Mood and Affect: Mood normal.        Behavior: Behavior normal.        Thought Content: Thought content normal.        Judgment: Judgment normal.     There were no vitals taken for this visit. Wt Readings from Last 3 Encounters:  09/04/15 231 lb (104.8 kg)  06/08/15 230 lb (104.3 kg)  06/02/15 230 lb 4.8 oz (104.5 kg)    Health Maintenance Due  Topic Date Due  . Hepatitis C Screening  Never done  . HIV Screening  Never done  . PAP SMEAR-Modifier  Never done  . COLONOSCOPY (Pts 45-76yr Insurance coverage will need to be confirmed)  Never done  . COVID-19 Vaccine (3 - Pfizer risk 4-dose series) 06/10/2019  . MAMMOGRAM  01/13/2020    There are no preventive care reminders to display for this patient.   No results found for: TSH Lab Results  Component Value Date   WBC 6.5 06/02/2015   HGB 13.2 06/02/2015   HCT 41.0 06/02/2015   MCV 91.3 06/02/2015   PLT 209 06/02/2015   Lab Results  Component Value Date   NA 142 06/02/2015   K 3.6 06/02/2015   CO2 24 06/02/2015   GLUCOSE 150 (H) 06/02/2015   BUN 10 06/02/2015   CREATININE 0.77 06/02/2015   BILITOT 0.3 11/03/2011   ALKPHOS 108 11/03/2011   AST 19 11/03/2011   ALT 22 11/03/2011   PROT 7.6 11/03/2011   ALBUMIN 3.9 11/03/2011   CALCIUM 9.6 06/02/2015   ANIONGAP 10 06/02/2015   No results found for: CHOL No results found  for: HDL No results found for: LDLCALC No results found for: TRIG No results found for: CHOLHDL No results found for: HGBA1C     Assessment & Plan:   Problem List Items Addressed This Visit   None      No orders of the defined types were placed in this encounter.    SWaldon Merl PA-C

## 2020-07-05 IMAGING — MG DIGITAL SCREENING BILATERAL MAMMOGRAM WITH TOMO AND CAD
8 series · 8 of 24 positions shown · non-contrast
Comparison: Previous exam(s).

CLINICAL DATA: Screening.

EXAM:
DIGITAL SCREENING BILATERAL MAMMOGRAM WITH TOMO AND CAD

[L CC synth-2D]
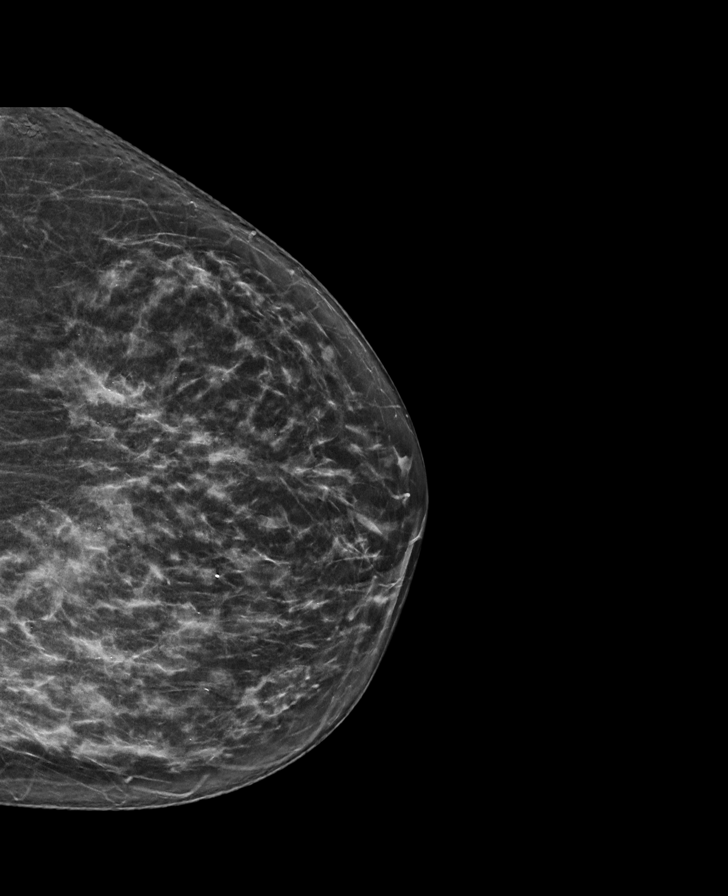

[R CC synth-2D]
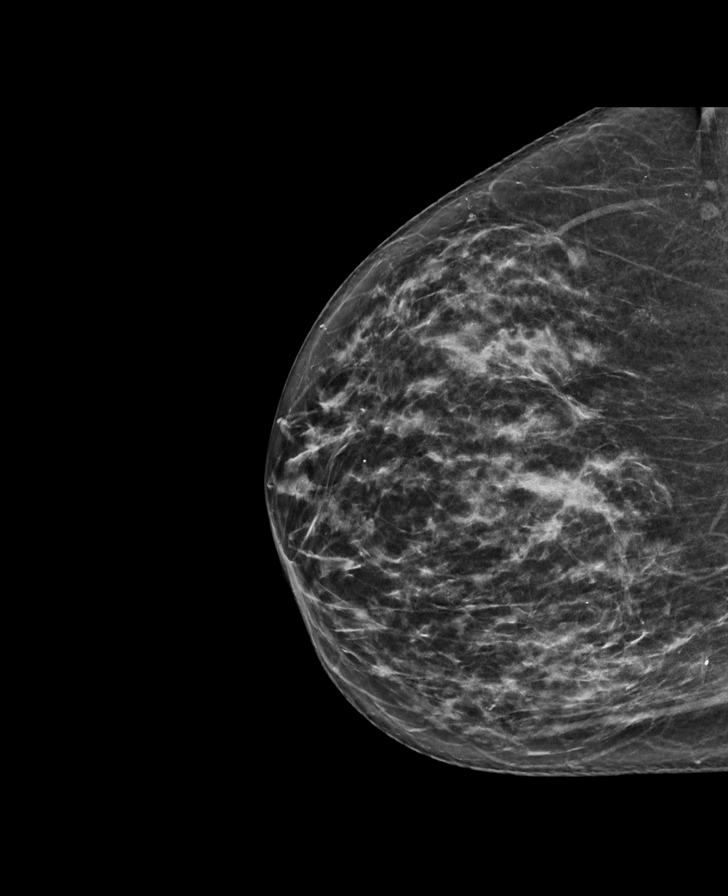

[L MLO synth-2D]
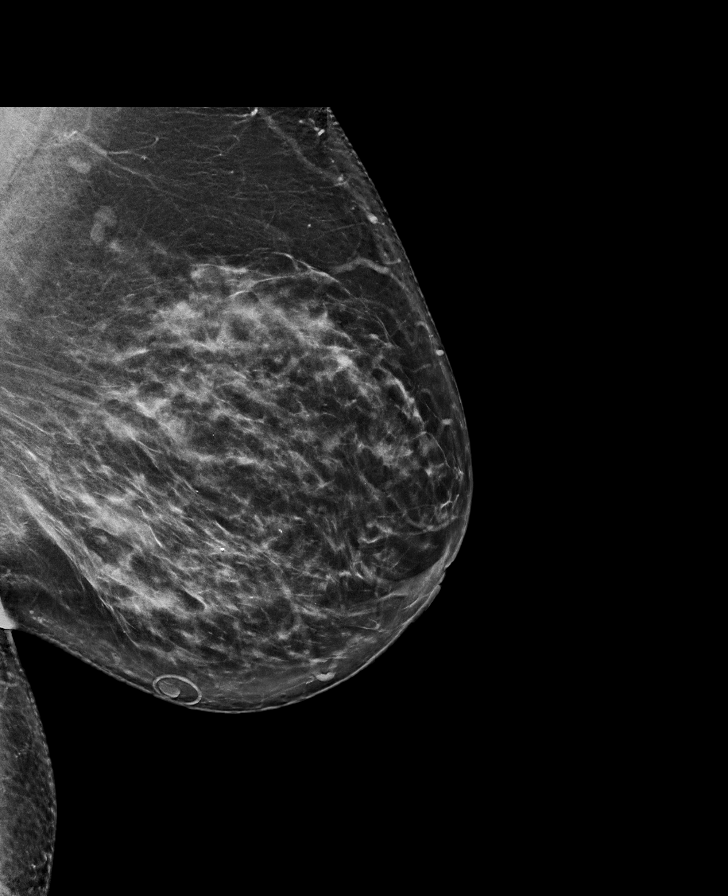

[R MLO synth-2D]
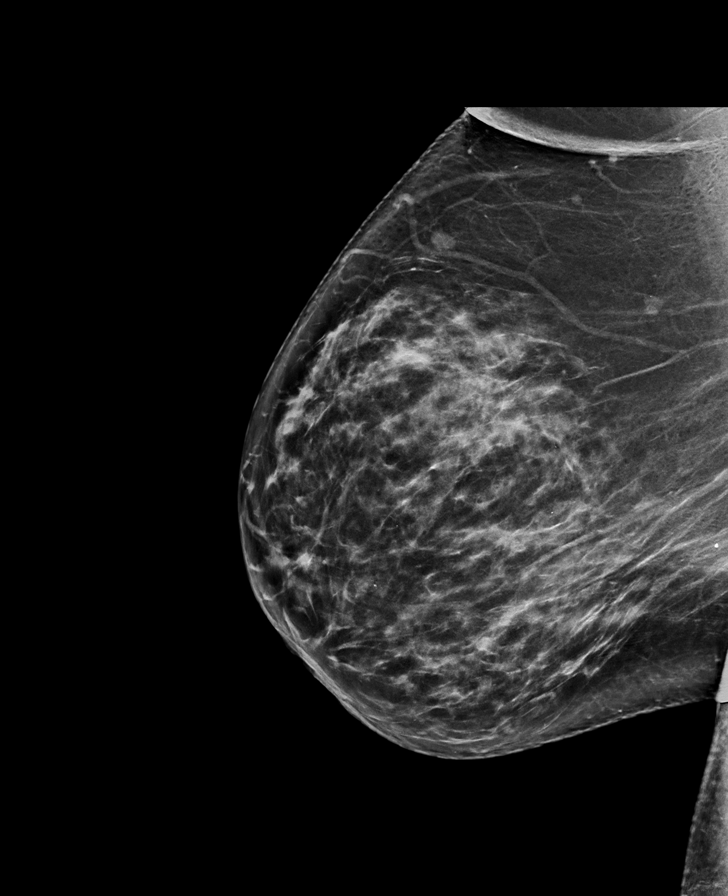

[R CC tomo · tomo slice 36/71.0]
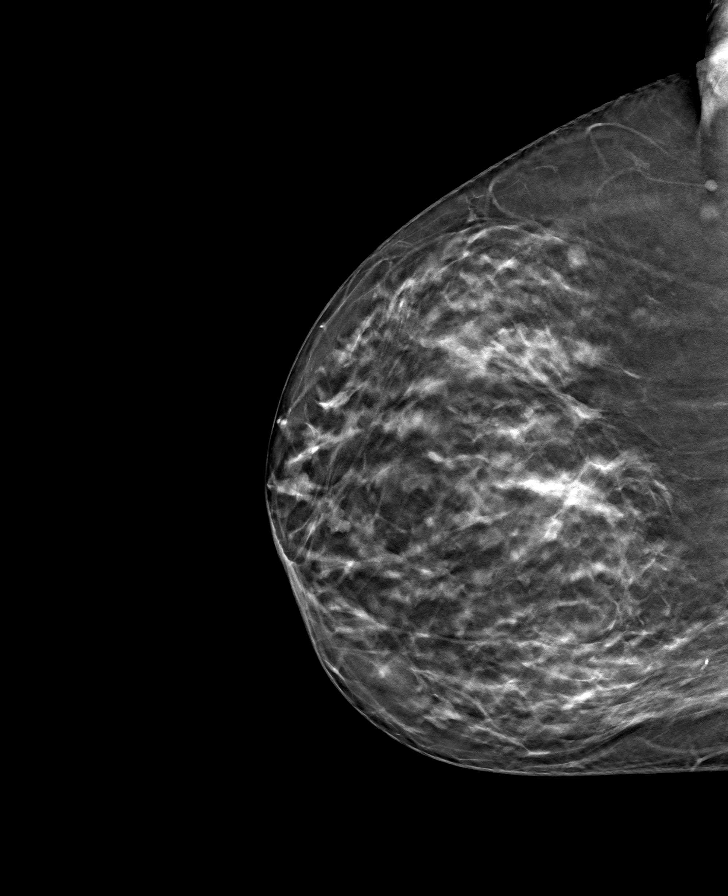

[R MLO tomo · tomo slice 42/83.0]
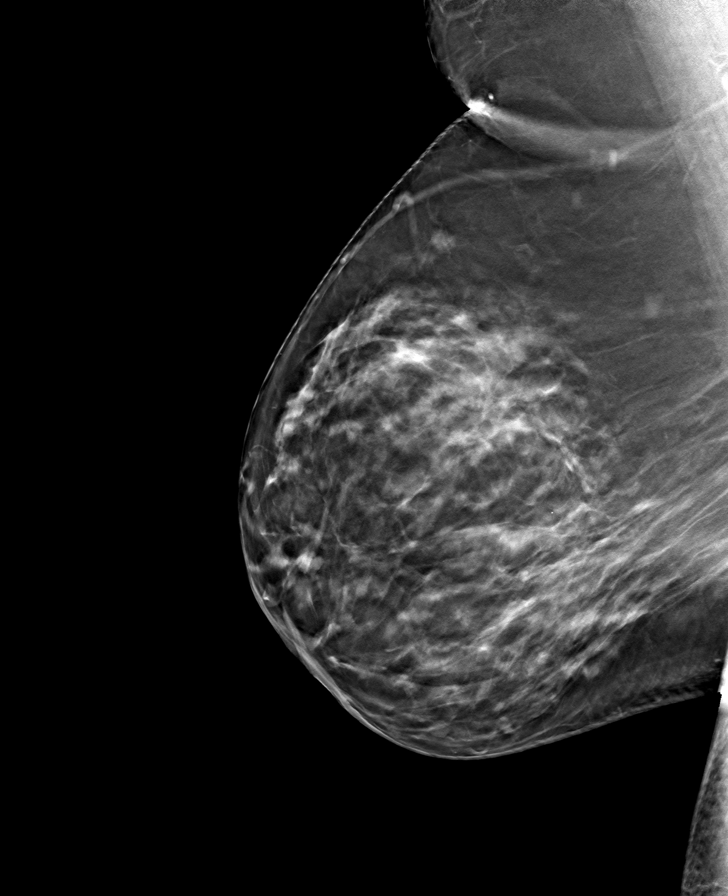

[L CC tomo · tomo slice 35/68.0]
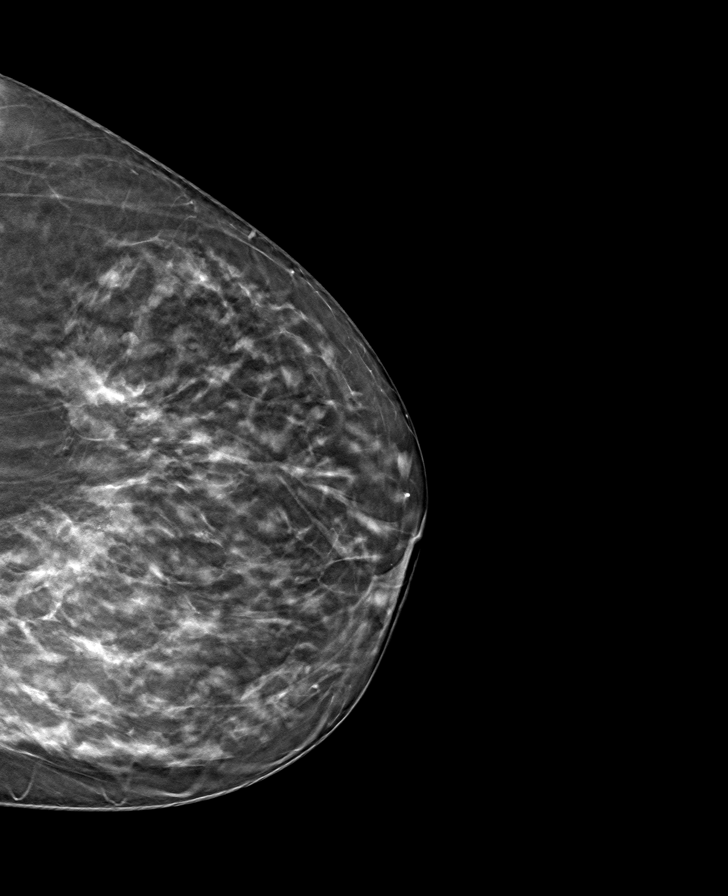

[L MLO tomo · tomo slice 43/86.0]
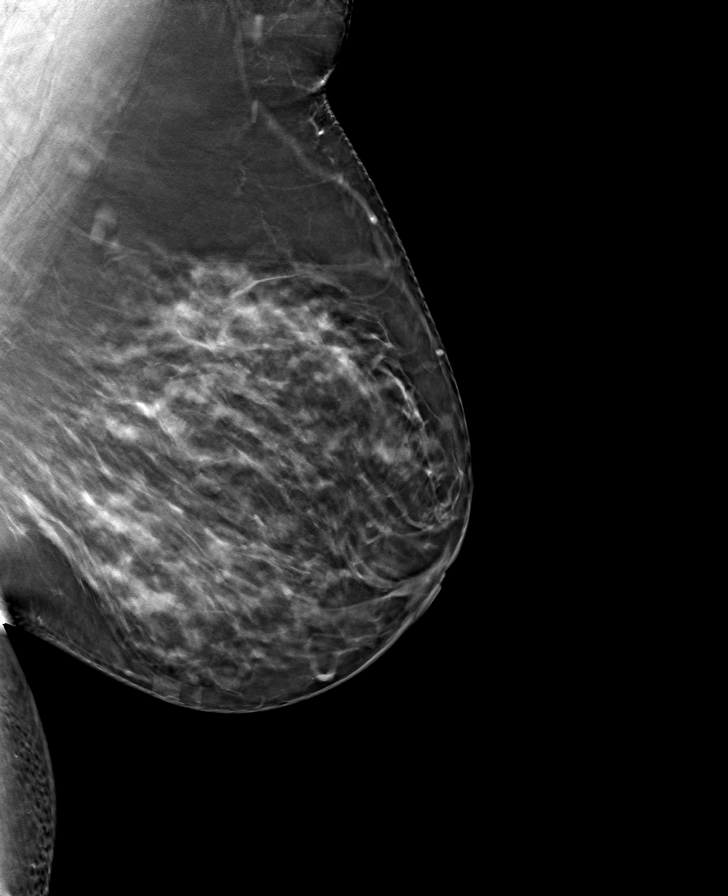

[8 of 24 positions shown; findings below may reference images not displayed]

ACR Breast Density Category c: The breast tissue is heterogeneously
dense, which may obscure small masses.
FINDINGS: There are no findings suspicious for malignancy. Images were
processed with CAD.
IMPRESSION: No mammographic evidence of malignancy. A result letter of this
screening mammogram will be mailed directly to the patient.

RECOMMENDATION:
Screening mammogram in one year. (Code:[id number])

BI-RADS CATEGORY  1: Negative.

## 2022-12-11 ENCOUNTER — Ambulatory Visit: Payer: Self-pay | Admitting: Podiatry

## 2022-12-12 ENCOUNTER — Ambulatory Visit: Payer: BC Managed Care – PPO | Admitting: Podiatry

## 2023-01-09 ENCOUNTER — Encounter: Payer: Self-pay | Admitting: Podiatry

## 2023-01-09 ENCOUNTER — Ambulatory Visit: Payer: BC Managed Care – PPO | Admitting: Podiatry

## 2023-01-09 ENCOUNTER — Ambulatory Visit (INDEPENDENT_AMBULATORY_CARE_PROVIDER_SITE_OTHER): Payer: BC Managed Care – PPO

## 2023-01-09 DIAGNOSIS — M7662 Achilles tendinitis, left leg: Secondary | ICD-10-CM

## 2023-01-09 DIAGNOSIS — M778 Other enthesopathies, not elsewhere classified: Secondary | ICD-10-CM

## 2023-01-09 MED ORDER — DEXAMETHASONE SODIUM PHOSPHATE 120 MG/30ML IJ SOLN
2.0000 mg | Freq: Once | INTRAMUSCULAR | Status: AC
  Administered 2023-01-09: 2 mg via INTRA_ARTICULAR

## 2023-01-09 MED ORDER — NITROGLYCERIN 0.2 MG/HR TD PT24: 0.2000 mg | MEDICATED_PATCH | Freq: Every day | TRANSDERMAL | 12 refills | Status: DC

## 2023-01-12 NOTE — Progress Notes (Signed)
Subjective:  Patient ID: Kristina Glover, female    DOB: 1960-04-21,  MRN: [id number] HPI Chief Complaint  Patient presents with   Foot Pain    Medial/posterior heel left - aching, swelling x 2 years, had bilateral knee replacements, left one being the latest-had Ortho check foot-put in boot, patches to wear, recommended surgery, does have a spur, currently has walking pneumonia-treated with 2 antibiotics and albuterol   New Patient (Initial Visit)    62 y.o. female presents with the above complaint.   ROS: Denies fever chills nausea vomit muscle aches pains calf pain back pain chest pain shortness of breath.  Past Medical History:  Diagnosis Date   Anxiety    Arthritis    Depression    Fibrocystic breast disease    Headache    MIGRAINES     Hypertension    OSA (obstructive sleep apnea)    CPAP   USES AT NIGHT   PONV (postoperative nausea and vomiting)    RLS (restless legs syndrome)    Past Surgical History:  Procedure Laterality Date   ANTERIOR CERVICAL DECOMP/DISCECTOMY FUSION N/A 06/08/2015   Procedure: ANTERIOR CERVICAL DISCECTOMY FUSION C4-C5, C5-C6     (2 LEVELS) ;  Surgeon: Venita Lick, MD;  Location: [facility] OR;  Service: Orthopedics;  Laterality: N/A;   cyst removed     on hand x 2   scar tissue removed     right foot   SHOULDER SURGERY     right   TOTAL ABDOMINAL HYSTERECTOMY      Current Outpatient Medications:    amLODipine (NORVASC) 2.5 MG tablet, Take 2.5 mg by mouth every morning., Disp: , Rfl:    MOUNJARO 7.5 MG/0.5ML Pen, SMARTSIG:7.5 Milligram(s) SUB-Q Once a Week, Disp: , Rfl:    nitroGLYCERIN (NITRO-DUR) 0.2 mg/hr patch, Place 1 patch (0.2 mg total) onto the skin daily., Disp: 30 patch, Rfl: 12   rosuvastatin (CRESTOR) 5 MG tablet, Take 5 mg by mouth once a week., Disp: , Rfl:    amphetamine-dextroamphetamine (ADDERALL XR) 20 MG 24 hr capsule, Take 20 mg by mouth daily., Disp: , Rfl:    aspirin 81 MG chewable tablet, Chew 81 mg by mouth daily., Disp: ,  Rfl:    azithromycin (ZITHROMAX) 250 MG tablet, TAKE 2 TABLETS (500 MG) BY ORAL ROUTE ONCE DAILY FOR 1 DAY THEN 1 TABLET (250 MG) BY ORAL ROUTE ONCE DAILY FOR 4 DAYS, Disp: , Rfl:    buPROPion (WELLBUTRIN XL) 150 MG 24 hr tablet, Take 150 mg by mouth daily., Disp: , Rfl:    Cholecalciferol (HM VITAMIN D3) 4000 units CAPS, Take 4,000 Units by mouth daily., Disp: , Rfl:    Cyanocobalamin (B-12 COMPLIANCE INJECTION) 1000 MCG/ML KIT, Inject 1,000 mcg as directed every 30 (thirty) days., Disp: , Rfl:    furosemide (LASIX) 20 MG tablet, Take 20 mg by mouth daily as needed for fluid. , Disp: , Rfl:    losartan (COZAAR) 100 MG tablet, TAKE 1 TABLET BY MOUTH EVERY DAY IN THE MORNING, Disp: , Rfl:    losartan-hydrochlorothiazide (HYZAAR) 100-12.5 MG tablet, Take 1 tablet by mouth daily., Disp: , Rfl:    methocarbamol (ROBAXIN) 500 MG tablet, Take 1 tablet (500 mg total) by mouth 3 (three) times daily as needed for muscle spasms. (Patient not taking: Reported on 09/04/2015), Disp: 60 tablet, Rfl: 0   metoprolol succinate (TOPROL-XL) 50 MG 24 hr tablet, Take 50 mg by mouth daily. , Disp: , Rfl:    mometasone (  NASONEX) 50 MCG/ACT nasal spray, Place 1 spray into the nose daily. , Disp: , Rfl:    sertraline (ZOLOFT) 100 MG tablet, Take 200 mg by mouth daily. , Disp: , Rfl:   Allergies  Allergen Reactions   Sulfamethoxazole Shortness Of Breath   Gabapentin     Other Reaction(s): respiratory distress   Lisinopril     Other Reaction(s): dizziness, dizziness, tinnitus   Methylphenidate     Other Reaction(s): heart racing, irregular heart rate   Adhesive [Tape] Other (See Comments)    Blisters   Aripiprazole     Other Reaction(s): did not work   Bupropion Hcl Er (Xl)     Other Reaction(s): increased anxiety, swelling   Citalopram     Other Reaction(s): did not work   Cyanocobalamin     Other Reaction(s): oral only causes diarrhea   Imodium [Loperamide] Hives   Levofloxacin Other (See Comments)     Unknown   Losartan Potassium-Hctz     Other Reaction(s): off balance   Semaglutide     Other Reaction(s): Depression   Sulfamethoxazole-Trimethoprim     Other Reaction(s): hives   Sulfonamide Derivatives Other (See Comments)    Dehydration   Sulfur Dioxide     Other Reaction(s): Not available   Varenicline     Other Reaction(s): depression   Venlafaxine     Other Reaction(s): did not work   Diphenhydramine Hcl Palpitations    Heart race   Review of Systems Objective:  There were no vitals filed for this visit.  General: Well developed, nourished, in no acute distress, alert and oriented x3   Dermatological: Skin is warm, dry and supple bilateral. Nails x 10 are well maintained; remaining integument appears unremarkable at this time. There are no open sores, no preulcerative lesions, no rash or signs of infection present.  Vascular: Dorsalis Pedis artery and Posterior Tibial artery pedal pulses are 2/4 bilateral with immedate capillary fill time. Pedal hair growth present. No varicosities and no lower extremity edema present bilateral.   Neruologic: Grossly intact via light touch bilateral. Vibratory intact via tuning fork bilateral. Protective threshold with Semmes Wienstein monofilament intact to all pedal sites bilateral. Patellar and Achilles deep tendon reflexes 2+ bilateral. No Babinski or clonus noted bilateral.   Musculoskeletal: No gross boney pedal deformities bilateral. No pain, crepitus, or limitation noted with foot and ankle range of motion bilateral. Muscular strength 5/5 in all groups tested bilateral.  Warm swollen area on the posterior aspect of the patient's left heel.  Pain on palpation as well as plantarflexion against resistance in the Achilles area.  Gait: Unassisted, Nonantalgic.    Radiographs:  Thickening of the Achilles tendon is noticed on lateral view soft tissue swelling and Kager's triangle and some small spurring appears to be avulsions for.  No  fractures identified mild demineralization of the bone.  Assessment & Plan:   Assessment: Retrocalcaneal heel spur and Achilles tendinitis with bursitis left.  Plan: Discussed etiology pathology and surgical therapies at this point injected the area today with dexamethasone and local anesthetic placed her in a short boot to be worn during the day and night splint for nighttime.  Placed her on nitro patches 0.2 mg to be changed every 24 hours.  Follow-up with her in 1 month     Josian Lanese T. [place], [place]

## 2023-02-11 ENCOUNTER — Ambulatory Visit: Payer: BC Managed Care – PPO | Admitting: Podiatry

## 2023-05-01 ENCOUNTER — Ambulatory Visit: Admitting: Podiatry

## 2023-05-01 ENCOUNTER — Encounter: Payer: Self-pay | Admitting: Podiatry

## 2023-05-01 DIAGNOSIS — S86312A Strain of muscle(s) and tendon(s) of peroneal muscle group at lower leg level, left leg, initial encounter: Secondary | ICD-10-CM | POA: Diagnosis not present

## 2023-05-01 DIAGNOSIS — S86012A Strain of left Achilles tendon, initial encounter: Secondary | ICD-10-CM | POA: Diagnosis not present

## 2023-05-01 NOTE — Progress Notes (Signed)
 She presents today after having visited [place] with a chief complaint of pain to her heel.  She states that she tried wearing the boot after the injection last time after the medication she says but really nothing seems to help it.  Is just killing me.  Is really starting to hurt even and here she points to the perineal area of her left ankle.  She states that the boot really bothered her hips and her new knee.  Objective: Vital signs are stable alert oriented x 3.  Pulses are palpable.  She has severe pain on palpation of the Achilles tendon of the left heel pain on palpation of the peroneals.  The area is significantly swollen red and hot I do not feel a large defect in the lateral border of the tendon we cannot rule out a small defect in distal lateral aspect.  Assessment: Chronic intractable Achilles tendinitis cannot rule out a tear of the Achilles tendon and now tendinitis or partial tear of the peroneal tendons left.  Plan: Discussed etiology pathology conservative surgical therapies at this point I highly recommended that she continue anti-inflammatories ice therapy and boot therapy.  In the meantime we are going to request an MRI for visualization surgical consideration and differential diagnoses of the posterior and posterior lateral aspect of that left foot and ankle.

## 2023-05-16 ENCOUNTER — Ambulatory Visit
Admission: RE | Admit: 2023-05-16 | Discharge: 2023-05-16 | Disposition: A | Discharge status: Home / Self Care | Payer: Self-pay | Source: Ambulatory Visit | Attending: Podiatry | Admitting: Podiatry

## 2023-05-16 DIAGNOSIS — S86012A Strain of left Achilles tendon, initial encounter: Secondary | ICD-10-CM

## 2023-05-16 DIAGNOSIS — S86312A Strain of muscle(s) and tendon(s) of peroneal muscle group at lower leg level, left leg, initial encounter: Secondary | ICD-10-CM

## 2023-06-03 ENCOUNTER — Other Ambulatory Visit: Payer: Self-pay

## 2023-06-03 DIAGNOSIS — M7662 Achilles tendinitis, left leg: Secondary | ICD-10-CM

## 2023-06-12 ENCOUNTER — Encounter: Payer: Self-pay | Admitting: Podiatry

## 2023-06-12 ENCOUNTER — Ambulatory Visit: Admitting: Podiatry

## 2023-06-12 VITALS — Ht 64.0 in | Wt 231.0 lb

## 2023-06-12 DIAGNOSIS — M7662 Achilles tendinitis, left leg: Secondary | ICD-10-CM | POA: Diagnosis not present

## 2023-06-16 NOTE — Progress Notes (Signed)
 She presents today for follow-up of her Achilles tendinitis states that is still quite sore.  Objective: No change in physical exam we did discuss the MRI findings today which are consistent with a chronic tendinosis with a heel spur.  We did discuss physical therapy the retrocalcaneal bursitis and the fact that there were no true tears present.  Assessment: Achilles tendinosis and bursitis.  Plan: Discussed physical therapy other conservative therapies and the possible need for surgical consideration retrocalcaneal heel spur resection.

## 2023-08-01 ENCOUNTER — Other Ambulatory Visit: Payer: Self-pay

## 2023-08-01 ENCOUNTER — Emergency Department (HOSPITAL_COMMUNITY)

## 2023-08-01 ENCOUNTER — Other Ambulatory Visit: Payer: Self-pay | Admitting: Student

## 2023-08-01 ENCOUNTER — Emergency Department (HOSPITAL_COMMUNITY): Admission: EM | Admit: 2023-08-01 | Discharge: 2023-08-01 | Disposition: A | Discharge status: Home / Self Care

## 2023-08-01 ENCOUNTER — Encounter (HOSPITAL_COMMUNITY): Payer: Self-pay

## 2023-08-01 DIAGNOSIS — I4892 Unspecified atrial flutter: Secondary | ICD-10-CM

## 2023-08-01 DIAGNOSIS — R7989 Other specified abnormal findings of blood chemistry: Secondary | ICD-10-CM

## 2023-08-01 DIAGNOSIS — Z79899 Other long term (current) drug therapy: Secondary | ICD-10-CM | POA: Insufficient documentation

## 2023-08-01 DIAGNOSIS — Z7982 Long term (current) use of aspirin: Secondary | ICD-10-CM | POA: Diagnosis not present

## 2023-08-01 DIAGNOSIS — I4891 Unspecified atrial fibrillation: Secondary | ICD-10-CM | POA: Diagnosis not present

## 2023-08-01 DIAGNOSIS — E876 Hypokalemia: Secondary | ICD-10-CM

## 2023-08-01 DIAGNOSIS — R002 Palpitations: Secondary | ICD-10-CM | POA: Diagnosis present

## 2023-08-01 DIAGNOSIS — I10 Essential (primary) hypertension: Secondary | ICD-10-CM

## 2023-08-01 LAB — MAGNESIUM: Magnesium: 2.1 mg/dL (ref 1.7–2.4)

## 2023-08-01 LAB — CBC
HCT: 45.5 % (ref 36.0–46.0)
Hemoglobin: 15.3 g/dL — ABNORMAL HIGH (ref 12.0–15.0)
MCH: 29.3 pg (ref 26.0–34.0)
MCHC: 33.6 g/dL (ref 30.0–36.0)
MCV: 87.2 fL (ref 80.0–100.0)
Platelets: 255 10*3/uL (ref 150–400)
RBC: 5.22 MIL/uL — ABNORMAL HIGH (ref 3.87–5.11)
RDW: 13.3 % (ref 11.5–15.5)
WBC: 9.7 10*3/uL (ref 4.0–10.5)
nRBC: 0 % (ref 0.0–0.2)

## 2023-08-01 LAB — BASIC METABOLIC PANEL WITH GFR
Anion gap: 16 — ABNORMAL HIGH (ref 5–15)
BUN: 8 mg/dL (ref 8–23)
CO2: 19 mmol/L — ABNORMAL LOW (ref 22–32)
Calcium: 9.5 mg/dL (ref 8.9–10.3)
Chloride: 107 mmol/L (ref 98–111)
Creatinine, Ser: 0.66 mg/dL (ref 0.44–1.00)
GFR, Estimated: >60 mL/min (ref >60)
Glucose, Bld: 112 mg/dL — ABNORMAL HIGH (ref 70–99)
Potassium: 3.4 mmol/L — ABNORMAL LOW (ref 3.5–5.1)
Sodium: 142 mmol/L (ref 135–145)

## 2023-08-01 LAB — TROPONIN I (HIGH SENSITIVITY)
Troponin I (High Sensitivity): 24 ng/L — ABNORMAL HIGH (ref <18)
Troponin I (High Sensitivity): 14 ng/L (ref <18)

## 2023-08-01 LAB — TSH: TSH: 1.387 u[IU]/mL (ref 0.350–4.500)

## 2023-08-01 MED ORDER — APIXABAN 5 MG PO TABS: 5.0000 mg | ORAL_TABLET | Freq: Two times a day (BID) | ORAL | 0 refills | Status: DC

## 2023-08-01 MED ORDER — METOPROLOL SUCCINATE ER 25 MG PO TB24
100.0000 mg | ORAL_TABLET | Freq: Every day | ORAL | Status: DC
  Administered 2023-08-01: 100 mg via ORAL
  Filled 2023-08-01: qty 4

## 2023-08-01 MED ORDER — POTASSIUM CHLORIDE CRYS ER 20 MEQ PO TBCR
40.0000 meq | EXTENDED_RELEASE_TABLET | Freq: Once | ORAL | Status: AC
  Administered 2023-08-01: 40 meq via ORAL
  Filled 2023-08-01: qty 2

## 2023-08-01 MED ORDER — LACTATED RINGERS IV BOLUS
1000.0000 mL | Freq: Once | INTRAVENOUS | Status: AC
  Administered 2023-08-01: 1000 mL via INTRAVENOUS

## 2023-08-01 MED ORDER — APIXABAN 5 MG PO TABS
5.0000 mg | ORAL_TABLET | Freq: Two times a day (BID) | ORAL | Status: DC
  Administered 2023-08-01: 5 mg via ORAL
  Filled 2023-08-01: qty 1

## 2023-08-01 MED ORDER — ADENOSINE 6 MG/2ML IV SOLN
6.0000 mg | Freq: Once | INTRAVENOUS | Status: AC
  Administered 2023-08-01: 6 mg via INTRAVENOUS
  Filled 2023-08-01: qty 2

## 2023-08-01 MED ORDER — APIXABAN 5 MG PO TABS
5.0000 mg | ORAL_TABLET | Freq: Two times a day (BID) | ORAL | Status: DC
Start: 2023-08-01 — End: 2023-08-01

## 2023-08-01 MED ORDER — LACTATED RINGERS IV BOLUS: 1000.0000 mL | Freq: Once | INTRAVENOUS | Status: DC

## 2023-08-01 MED ORDER — DILTIAZEM HCL 25 MG/5ML IV SOLN
20.0000 mg | Freq: Once | INTRAVENOUS | Status: AC
  Administered 2023-08-01: 20 mg via INTRAVENOUS
  Filled 2023-08-01: qty 5

## 2023-08-01 NOTE — ED Provider Notes (Signed)
 Patient was signed out from the morning provider.  Please refer to their note for additional details. This patient was diagnosed with new onset a fib/flutter She received adenosine then cardizem - flutter with rvr  Cards consulted - dispo per cards  Physical Exam  BP (!) 149/100   Pulse (!) 143   Temp 97.9 F (36.6 C) (Oral)   Resp 17   SpO2 100%   Physical Exam Vitals reviewed.  Constitutional:      General: She is not in acute distress.    Appearance: She is not toxic-appearing.   Cardiovascular:     Rate and Rhythm: Normal rate.  Pulmonary:     Effort: Pulmonary effort is normal.   Skin:    General: Skin is warm.   Neurological:     Mental Status: She is alert.     Gait: Gait normal.     Procedures  Procedures  ED Course / MDM    Medical Decision Making Amount and/or Complexity of Data Reviewed Labs: ordered. Radiology: ordered.  Risk Prescription drug management.   Patient's initial troponin was slightly elevated at 24.  Cardiology evaluated the patient at bedside.  Their recommendations are to increase her metoprolol  to 100 mg daily and start Eliquis twice daily. Cardiology asked for a repeat troponin and this would determine her disposition.  She continued to be well-appearing in the ED during the rest of her observation. Pt was asking to leave and we explained that her troponin was important to recheck. Troponin improved on recheck. Cardiology recs were DC and f/u outpatient.        Brysan Mcevoy, DO 08/03/23 1942

## 2023-08-01 NOTE — ED Provider Notes (Signed)
 [place] EMERGENCY DEPARTMENT AT [facility] Provider Note   CSN: [id number] Arrival date & time: 08/01/23  1221     Patient presents with: Palpitations   Kristina Glover is a 63 y.o. female.   63 year old female with past medical history of hypertension and hyperlipidemia presenting to the emergency department today with palpitations.  Patient states she has been having palpitations now over the past few days intermittently.  She was having some chest discomfort this morning earlier but denies any currently.  She was initially found to be tachycardic with her doctor.  I have looked at her EKG and it looks like it is either an SVT or possible atrial flutter.  She reports that her symptoms have improved and looks like on the monitor now her heart rate is in the 70s.  EKG with medics looks more consistent with SVT.  She states that she forgot to take her blood pressure medication yesterday but did take it this morning.  Also reports that she was following up with her doctor did not have anything to eat or drink this morning in anticipation for labs.   Palpitations      Prior to Admission medications   Medication Sig Start Date End Date Taking? Authorizing Provider  amLODipine (NORVASC) 2.5 MG tablet Take 2.5 mg by mouth every morning. 10/23/22   [provider]  amphetamine-dextroamphetamine (ADDERALL XR) 20 MG 24 hr capsule Take 20 mg by mouth daily. 04/19/15   [provider]  aspirin  81 MG chewable tablet Chew 81 mg by mouth daily.    [provider]  buPROPion  (WELLBUTRIN  XL) 150 MG 24 hr tablet Take 150 mg by mouth daily.    [provider]  Cholecalciferol (HM VITAMIN D3) 4000 units CAPS Take 4,000 Units by mouth daily.    [provider]  Cyanocobalamin (B-12 COMPLIANCE INJECTION) 1000 MCG/ML KIT Inject 1,000 mcg as directed every 30 (thirty) days.    [provider]  furosemide  (LASIX ) 20 MG tablet Take 20 mg by mouth  daily as needed for fluid.  05/15/15   [provider]  losartan  (COZAAR ) 100 MG tablet TAKE 1 TABLET BY MOUTH EVERY DAY IN THE MORNING    [provider]  losartan -hydrochlorothiazide  (HYZAAR) 100-12.5 MG tablet Take 1 tablet by mouth daily. 05/15/15   [provider]  methocarbamol  (ROBAXIN ) 500 MG tablet Take 1 tablet (500 mg total) by mouth 3 (three) times daily as needed for muscle spasms. 06/08/15   Burnetta Aures, MD  metoprolol  succinate (TOPROL -XL) 50 MG 24 hr tablet Take 50 mg by mouth daily.  09/02/13   [provider]  mometasone (NASONEX) 50 MCG/ACT nasal spray Place 1 spray into the nose daily.  03/24/15   [provider]  MOUNJARO 7.5 MG/0.5ML Pen SMARTSIG:7.5 Milligram(s) SUB-Q Once a Week 11/15/22   [provider]  nitroGLYCERIN  (NITRO-DUR ) 0.2 mg/hr patch Place 1 patch (0.2 mg total) onto the skin daily. 01/09/23   Hyatt, Max T, DPM  rosuvastatin (CRESTOR) 5 MG tablet Take 5 mg by mouth once a week. 07/18/22   [provider]  sertraline  (ZOLOFT ) 100 MG tablet Take 200 mg by mouth daily.     [provider]    Allergies: Sulfamethoxazole, Gabapentin, Lisinopril, Methylphenidate, Adhesive [tape], Aripiprazole, Bupropion  hcl er (xl), Citalopram, Cyanocobalamin, Imodium [loperamide], Levofloxacin, Losartan  potassium-hctz, Semaglutide, Sulfamethoxazole-trimethoprim, Sulfonamide derivatives, Sulfur dioxide, Varenicline, Venlafaxine, and Diphenhydramine hcl    Review of Systems  Cardiovascular:  Positive for palpitations.  All other systems reviewed and are negative.   Updated Vital Signs BP (!) 149/100   Pulse 74   Temp 97.9 F (36.6 C) (Oral)   Resp (!) 21   SpO2 97%   Physical Exam Vitals and nursing note reviewed.   Gen: NAD Eyes: PERRL, EOMI HEENT: no oropharyngeal swelling Neck: trachea midline Resp: clear to auscultation bilaterally Card: RRR, no murmurs, rubs, or gallops Abd: nontender,  nondistended Extremities: no calf tenderness, no edema Vascular: 2+ radial pulses bilaterally, 2+ DP pulses bilaterally Skin: no rashes Psyc: acting appropriately   (all labs ordered are listed, but only abnormal results are displayed) Labs Reviewed  BASIC METABOLIC PANEL WITH GFR - Abnormal; Notable for the following components:      Result Value   Potassium 3.4 (*)    CO2 19 (*)    Glucose, Bld 112 (*)    Anion gap 16 (*)    All other components within normal limits  CBC - Abnormal; Notable for the following components:   RBC 5.22 (*)    Hemoglobin 15.3 (*)    All other components within normal limits  TROPONIN I (HIGH SENSITIVITY) - Abnormal; Notable for the following components:   Troponin I (High Sensitivity) 24 (*)    All other components within normal limits  TSH  TROPONIN I (HIGH SENSITIVITY)    EKG: EKG Interpretation Date/Time:  Friday August 01 2023 13:35:39 EDT Ventricular Rate:  149 PR Interval:  110 QRS Duration:  93 QT Interval:  262 QTC Calculation: 413 R Axis:   74  Text Interpretation: Narrow complex tachycardia Repolarization abnormality, prob rate related Confirmed by Ula Barter [phone]) on 08/01/2023 2:27:06 PM  Radiology: DG Chest Portable 1 View Result Date: 08/01/2023 CLINICAL DATA:  Chest pain.  Palpitations.  Diaphoresis.  Dizziness. EXAM: PORTABLE CHEST 1 VIEW COMPARISON:  11/03/2011 FINDINGS: The heart size and mediastinal contours are within normal limits. Both lungs are clear. Cervical spine fusion hardware again noted. IMPRESSION: No active disease. Electronically Signed   By: Norleen DELENA Kil M.D.   On: 08/01/2023 13:49     Procedures   Medications Ordered in the ED  lactated ringers  bolus 1,000 mL (has no administration in time range)  lactated ringers  bolus 1,000 mL (0 mLs Intravenous Stopped 08/01/23 1500)  adenosine (ADENOCARD) 6 MG/2ML injection 6 mg (6 mg Intravenous Given 08/01/23 1354)  diltiazem (CARDIZEM) injection 20 mg (20 mg  Intravenous Given 08/01/23 1447)                                    Medical Decision Making 63 year old female with past medical history of hypertension and hyperlipidemia presenting to the emergency department today with chest discomfort which has since resolved as well as some palpitations.  The patient did appear to have a narrow complex tachycardia on both of her EKGs with medics and her primary care doctor.  Will keep her on the monitor here.  She has already shaved her blood pressure medications including metoprolol  this morning so it is possible that she is converted back to a sinus rhythm secondary to this.  Will obtain labs to evaluate for anemia or electrolyte abnormalities.  Given the chest discomfort will obtain a troponin.  With this resolving suspicion for pulmonary embolism is low with her pulse ox of 100%.  I will reevaluate for ultimate disposition.  The patient can go back into a narrow complex tachycardia with  a rate at 149-150.  This did remain stable.  The patient is given adenosine and briefly appeared to go back into a sinus rhythm but went back into what appears to be an SVT.  I did not appreciate any obvious flutter waves during the moment of pause.  Patient's blood pressure remains elevated here.  Will give her Cardizem here and reevaluate.  If this is unsuccessful we will discuss case with cardiology.  The patient's heart rate improved to the low 100s and high 90s after Cardizem.  I did call and discussed this with cardiology.  They will come down and assessed the patient and determine ultimate disposition.  CRITICAL CARE Performed by: Prentice JONELLE Medicus   Total critical care time: 35 minutes  Critical care time was exclusive of separately billable procedures and treating other patients.  Critical care was necessary to treat or prevent imminent or life-threatening deterioration.  Critical care was time spent personally by me on the following activities: development of  treatment plan with patient and/or surrogate as well as nursing, discussions with consultants, evaluation of patient's response to treatment, examination of patient, obtaining history from patient or surrogate, ordering and performing treatments and interventions, ordering and review of laboratory studies, ordering and review of radiographic studies, pulse oximetry and re-evaluation of patient's condition.   Amount and/or Complexity of Data Reviewed Labs: ordered. Radiology: ordered.  Risk Prescription drug management.        Final diagnoses:  Atrial fibrillation with RVR [facility])    ED Discharge Orders     None          Medicus Prentice JONELLE, MD 08/01/23 1704

## 2023-08-01 NOTE — Consult Note (Addendum)
 Cardiology Consultation   Glover ID: Kristina Glover MRN: [id number]; DOB: Mar 03, 1960  Admit date: 08/01/2023 Date of Consult: 08/01/2023  PCP:  Default, Provider, MD   [facility] Providers Cardiologist:  Lynwood Schilling, MD       Glover Profile: Kristina Glover is a 63 y.o. female with a prior history of obstructive sleep apnea, tobacco abuse, morbid obesity, type 2 diabetes COPD, hyperlipidemia, CAD on coronary CTA in 2013, and hypertension who is being seen 08/01/2023 for Kristina evaluation of palpitations at Kristina request of [facility].  History of Present Illness:  Kristina Glover is a 63 year old female who saw cardiology back in 2013.  At that time she was seen for dizziness, headache, and exertional chest discomfort.  A coronary CTA was done and showed noncalcified plaque causing 50% stenosis in Kristina proximately LAD, and noncalcified plaque causing 25% stenosis in Kristina left circumflex.  On 05/27/2023 Kristina Glover was diagnosed with community-acquired pneumonia and started on antibiotics.  Kristina Glover presented to Kristina emergency department on 08/01/2023 complaining of palpitations over Kristina past few days.  She went to a checkup with her PCP this morning and was referred to Kristina emergency department after they noticed her increased heart rates. On interview Kristina Glover reported having palpitations, fatigue, sluggishness, shakiness, and a racing heart that started yesterday morning.  Reported that she had some extensive dental work done yesterday. She had similar symptoms about a month and a half ago.  She forgot to take her metoprolol  yesterday.  She has obstructive sleep apnea and has not been wearing her CPAP recently because of Kristina temporary bridge that was replaced interfering with her mask.  Dentistry plans to put a permanent bridge in a couple days and then she plans to restart using her CPAP machine. smokes cigarettes, drinks alcohol about once a month, denies marijuana use and licit substance  use.  Labs showed elevated hemoglobin of 15.3, hypokalemia with a potassium of 3.4, decreased bicarb of 19, normal TSH of 1.387.   Chest x-ray showed no active disease. EKG showed atrial flutter with a 2's to 1 AV ratio and a rate of 147.   Past Medical History:  Diagnosis Date   Anxiety    Arthritis    Depression    Fibrocystic breast disease    Headache    MIGRAINES     Hypertension    OSA (obstructive sleep apnea)    CPAP   USES AT NIGHT   PONV (postoperative nausea and vomiting)    RLS (restless legs syndrome)     Past Surgical History:  Procedure Laterality Date   ANTERIOR CERVICAL DECOMP/DISCECTOMY FUSION N/A 06/08/2015   Procedure: ANTERIOR CERVICAL DISCECTOMY FUSION C4-C5, C5-C6     (2 LEVELS) ;  Surgeon: Donaciano Sprang, MD;  Location: [facility] OR;  Service: Orthopedics;  Laterality: N/A;   cyst removed     on hand x 2   scar tissue removed     right foot   SHOULDER SURGERY     right   TOTAL ABDOMINAL HYSTERECTOMY       Home Medications:  Prior to Admission medications   Medication Sig Start Date End Date Taking? Authorizing Provider  amLODipine (NORVASC) 2.5 MG tablet Take 2.5 mg by mouth every morning. 10/23/22   [provider]  amphetamine-dextroamphetamine (ADDERALL XR) 20 MG 24 hr capsule Take 20 mg by mouth daily. 04/19/15   [provider]  aspirin  81 MG chewable tablet Chew 81 mg by mouth daily.  [provider]  buPROPion  (WELLBUTRIN  XL) 150 MG 24 hr tablet Take 150 mg by mouth daily.    [provider]  Cholecalciferol (HM VITAMIN D3) 4000 units CAPS Take 4,000 Units by mouth daily.    [provider]  Cyanocobalamin (B-12 COMPLIANCE INJECTION) 1000 MCG/ML KIT Inject 1,000 mcg as directed every 30 (thirty) days.    [provider]  furosemide  (LASIX ) 20 MG tablet Take 20 mg by mouth daily as needed for fluid.  05/15/15   [provider]  losartan  (COZAAR ) 100 MG tablet TAKE 1 TABLET BY MOUTH EVERY DAY  [name]    [provider]  losartan -hydrochlorothiazide  (HYZAAR) 100-12.5 MG tablet Take 1 tablet by mouth daily. 05/15/15   [provider]  methocarbamol  (ROBAXIN ) 500 MG tablet Take 1 tablet (500 mg total) by mouth 3 (three) times daily as needed for muscle spasms. 06/08/15   Burnetta Aures, MD  metoprolol  succinate (TOPROL -XL) 50 MG 24 hr tablet Take 50 mg by mouth daily.  09/02/13   [provider]  mometasone (NASONEX) 50 MCG/ACT nasal spray Place 1 spray into Kristina nose daily.  03/24/15   [provider]  MOUNJARO 7.5 MG/0.5ML Pen SMARTSIG:7.5 Milligram(s) SUB-Q Once a Week 11/15/22   [provider]  nitroGLYCERIN  (NITRO-DUR ) 0.2 mg/hr patch Place 1 patch (0.2 mg total) onto Kristina skin daily. 01/09/23   Hyatt, Max T, DPM  rosuvastatin (CRESTOR) 5 MG tablet Take 5 mg by mouth once a week. 07/18/22   [provider]  sertraline  (ZOLOFT ) 100 MG tablet Take 200 mg by mouth daily.     [provider]    Scheduled Meds:  Continuous Infusions:  lactated ringers      PRN Meds:   Allergies:    Allergies  Allergen Reactions   Sulfamethoxazole Shortness Of Breath   Gabapentin     Other Reaction(s): respiratory distress   Lisinopril     Other Reaction(s): dizziness, dizziness, tinnitus   Methylphenidate     Other Reaction(s): heart racing, irregular heart rate   Adhesive [Tape] Other (See Comments)    Blisters   Aripiprazole     Other Reaction(s): did not work   Bupropion  Hcl Er (Xl)     Other Reaction(s): increased anxiety, swelling   Citalopram     Other Reaction(s): did not work   Cyanocobalamin     Other Reaction(s): oral only causes diarrhea   Imodium [Loperamide] Hives   Levofloxacin Other (See Comments)    Unknown   Losartan  Potassium-Hctz     Other Reaction(s): off balance   Semaglutide     Other Reaction(s): Depression   Sulfamethoxazole-Trimethoprim     Other Reaction(s): hives   Sulfonamide Derivatives  Other (See Comments)    Dehydration   Sulfur Dioxide     Other Reaction(s): Not available   Varenicline     Other Reaction(s): depression   Venlafaxine     Other Reaction(s): did not work   Diphenhydramine Hcl Palpitations    Heart race    Social History:   Social History   Socioeconomic History   Marital status: Divorced    Spouse name: Not on file   Number of children: Not on file   Years of education: Not on file   Highest education level: Not on file  Occupational History   Occupation: 3rd grade teacher  Tobacco Use   Smoking status: Former    Current packs/day: 0.00    Average packs/day: 1 pack/day for 35.0  years (35.0 ttl pk-yrs)    Types: Cigarettes    Start date: 08/08/1977    Quit date: 08/08/2012    Years since quitting: 10.9   Smokeless tobacco: Not on file  Substance and Sexual Activity   Alcohol use: No   Drug use: No   Sexual activity: Not on file  Other Topics Concern   Not on file  Social History Narrative   Not on file   Social Drivers of Health   Financial Resource Strain: Not on file  Food Insecurity: Not on file  Transportation Needs: Not on file  Physical Activity: Not on file  Stress: Not on file  Social Connections: Not on file  Intimate Partner Violence: Not on file    Family History:    Family History  Problem Relation Age of Onset   Emphysema Father    Cancer Father        unsure what kind   Allergies Sister    Heart disease Mother    Heart disease Sister    Breast cancer Neg Hx      ROS:  Please see Kristina history of present illness.   All other ROS reviewed and negative.     Physical Exam/Data: Vitals:   08/01/23 1232 08/01/23 1235 08/01/23 1322  BP: (!) 149/100    Pulse: (!) 143    Resp:  17   Temp:  97.9 F (36.6 C) 97.9 F (36.6 C)  TempSrc:  Oral Oral  SpO2: 100%     No intake or output data in Kristina 24 hours ending 08/01/23 1653    06/12/2023    1:48 PM 09/04/2015   10:32 AM 06/08/2015    6:05 AM  Last 3 Weights   Weight (lbs) 231 lb 231 lb 230 lb  Weight (kg) 104.781 kg 104.781 kg 104.327 kg     There is no height or weight on file to calculate BMI.  General:  Well nourished, well developed, in no acute distress HEENT: normal Neck: no JVD Vascular: No carotid bruits; Distal pulses 2+ bilaterally Cardiac:  normal S1, S2; RRR; no murmur  Lungs:  clear to auscultation bilaterally, no wheezing, rhonchi or rales  Abd: soft, nontender, no hepatomegaly  Ext: no edema Musculoskeletal:  No deformities. Skin: warm and dry  Neuro:   no focal abnormalities noted Psych:  Normal affect   EKG:  Kristina EKG was personally reviewed and demonstrates:  Atrial flutter with a 2's to 1 AV ratio and a rate of 147. Telemetry:  Telemetry was personally reviewed and demonstrates: Was placed on telemetry at 1334 was initially in atrial flutter with a rate of about 149.  At about 1420 rates started decreasing due to Kristina variable AV conduction.  At 1542 converted to normal sinus rhythm  Relevant CV Studies: Plan to order outpatient echo  Laboratory Data: High Sensitivity Troponin:   Recent Labs  Lab 08/01/23 1239  TROPONINIHS 24*     Chemistry Recent Labs  Lab 08/01/23 1239  NA 142  K 3.4*  CL 107  CO2 19*  GLUCOSE 112*  BUN 8  CREATININE 0.66  CALCIUM 9.5  GFRNONAA >60  ANIONGAP 16*    No results for input(s): PROT, ALBUMIN, AST, ALT, ALKPHOS, BILITOT in Kristina last 168 hours. Lipids No results for input(s): CHOL, TRIG, HDL, LABVLDL, LDLCALC, CHOLHDL in Kristina last 168 hours.  Hematology Recent Labs  Lab 08/01/23 1239  WBC 9.7  RBC 5.22*  HGB 15.3*  HCT 45.5  MCV 87.2  MCH 29.3  MCHC 33.6  RDW 13.3  PLT 255   Thyroid  Recent Labs  Lab 08/01/23 1239  TSH 1.387    BNPNo results for input(s): BNP, PROBNP in Kristina last 168 hours.  DDimer No results for input(s): DDIMER in Kristina last 168 hours.  Radiology/Studies:  DG Chest Portable 1 View Result Date:  08/01/2023 CLINICAL DATA:  Chest pain.  Palpitations.  Diaphoresis.  Dizziness. EXAM: PORTABLE CHEST 1 VIEW COMPARISON:  11/03/2011 FINDINGS: Kristina heart size and mediastinal contours are within normal limits. Both lungs are clear. Cervical spine fusion hardware again noted. IMPRESSION: No active disease. Electronically Signed   By: Norleen DELENA Kil M.D.   On: 08/01/2023 13:49     Assessment and Plan: NYELA CORTINAS is a 63 y.o. female with a prior history of obstructive sleep apnea, tobacco abuse, morbid obesity, type 2 diabetes COPD, hyperlipidemia, and hypertension who is being seen 08/01/2023 for Kristina evaluation of palpitations at Kristina request of [facility].  Atrial flutter with RVR Hypokalemia CHA2DS2-VASc Score = 4 [CHF History: 0, HTN History: 1, Diabetes History: 1, Stroke History: 0, Vascular Disease History: 1, Age Score: 0, Gender Score: 1].  Therefore, Kristina Glover's annual risk of stroke is 4.8 %.  Initially on telemetry was in atrial flutter with RVR a twos to 1 AV ratio, and with rates of about 149.  At about 1542 converted to normal sinus rhythm with rates in Kristina 70s to 80s.  Denies any prior history of atrial fibrillation.   Denies any melena hematochezia, and hematouria. Has obstructive sleep apnea and has not been wearing CPAP recently. After dental work is completed and has a permanent upper bridge plans to restart wearing CPAP.  TSH normal.  Potassium decreased at 3.4 ordered replenishment.  Ordered magnesium. Increase metoprolol  to 100mg  daily Start Eliquis 5mg  BID.   CAD Non obstructive CAD on cardiac CT done in 2013   Hypertension Initial blood pressure was elevated.  Suspect this may be due to her missing her blood pressure medications yesterday.  Stop amlodipine 2.5 mg. Increase metoprolol  to 100 mg daily as above Continue home losartan , and hydrochlorothiazide   Elevated high-sensitivity troponin 24> Second troponin pending. If second troponin is similar to initial troponin  then I suspect that this is secondary to demand ischemia from atrial flutter with RVR. If second troponin is similar to initial troponin and Glover stays in sinus rhythm then may proceed with discharge per Dr Lavona.  Otherwise manage per primary     Risk Assessment/Risk Scores:       CHA2DS2-VASc Score = 4   This indicates a 4.8% annual risk of stroke. Kristina Glover's score is based upon: CHF History: 0 HTN History: 1 Diabetes History: 1 Stroke History: 0 Vascular Disease History: 1 Age Score: 0 Gender Score: 1     [facility] will sign off.   Kristina Glover is ready for discharge today from a cardiac standpoint. Medication Recommendations:  as per above Other recommendations (labs, testing, etc):  follow up echo Follow up as an outpatient:  follow up with afib clinic  For questions or updates, please contact [facility] Please consult [email or web address] for contact info under    Signed, Morse Clause, PA-C  08/01/2023 4:53 PM  History and all data above reviewed.  Glover examined.  I agree with Kristina findings as above.   Kristina Glover presents with palpitations and was found to be in probably 2-1 flutter and eventually slowed to atrial fibrillation.  In Kristina emergency room she converted to sinus rhythm after Cardizem.  She been feeling palpitations on and off for a while.  They had been short-lived but felt like she did today.  Today they were more sustained but she probably would have not really sought help about it.  She saw her primary provider was found to be in narrow complex rhythm rate 150.  She is otherwise not had any cardiac history.  She denies chest pressure, neck or arm discomfort.  She does not have any presyncope or syncope.  She tries to stay active with her grandchildren and rides a stationary bicycle.  She does have sleep apnea and has not been using her mask because of some dental surgery recently.  She has not taken metoprolol  quite on time  necessarily as she should.  She has been having a lot of problems with dental issues and has had some surgeries and more upcoming.  Kristina Glover exam reveals COR: Regular rate and rhythm,  Lungs: Clear to auscultation bilaterally,  Abd: Positive bowel sounds normal frequency and pitch, bruits, rebound, guarding, Ext 2+ pulses, no edema.  All available labs, radiology testing, previous records reviewed. Agree with documented assessment and plan.  Atrial fibrillation: Kristina Glover has atrial flutter/fibrillation with rapid rate.  TSH is normal.  Potassium is slightly low and we supplemented this.  Magnesium is pending and will need to be followed up as an outpatient.  We will check an echocardiogram as an outpatient.  We are going to stop amlodipine which she is taking for blood pressure and increasing metoprolol  to 100 mg daily.  She has follow-up scheduled in our electrophysiology clinic.  She has no contraindication anticoagulation and should be started on Eliquis 5 mg twice daily.  Sleep apnea: When she is able to after her dental surgery she needs to resume her CPAP.  Hypertension: This be managed in Kristina context of treating her atrial fibrillation Lynwood Schilling  5:44 PM  08/01/2023

## 2023-08-01 NOTE — Progress Notes (Signed)
 Ordered for new onset atrial flutter. Dr Lavona to read.

## 2023-08-01 NOTE — Discharge Instructions (Addendum)
 You were seen in the emergency department today due to your new onset atrial fibrillation.  Cardiology has recommended some medication changes and we have started you on Eliquis.  Please follow-up with your cardiologist and electrophysiologist as planned.  Return to the emergency department if you develop any further palpitations or irregular heart rate.

## 2023-08-01 NOTE — ED Notes (Signed)
 Pt continues to remove her self from monitor states it hurt her ans she is not willing to stay connected. Pt oriented for her safety is need to monitor her at all the time until dc home.

## 2023-08-01 NOTE — ED Triage Notes (Signed)
 PT BIB EMS from her primary Dr's office for palpitations, was dizzy and sweaty while standing, was not orthostatic. HR 160's upon EMS arrival.   160's - 140's 500 ML NS 20 Right hand CBG 121 96% RA 140/78

## 2023-08-01 NOTE — ED Notes (Signed)
 CCMD called to admit the patient to cardiac monitoring.

## 2023-08-13 ENCOUNTER — Inpatient Hospital Stay (HOSPITAL_COMMUNITY)
Admission: RE | Admit: 2023-08-13 | Discharge: 2023-08-13 | Disposition: A | Discharge status: Home / Self Care | Source: Ambulatory Visit | Attending: Internal Medicine | Admitting: Internal Medicine

## 2023-08-13 ENCOUNTER — Telehealth (HOSPITAL_COMMUNITY): Payer: Self-pay | Admitting: *Deleted

## 2023-08-13 ENCOUNTER — Ambulatory Visit (HOSPITAL_COMMUNITY)
Admission: RE | Admit: 2023-08-13 | Discharge: 2023-08-13 | Disposition: A | Discharge status: Home / Self Care | Source: Ambulatory Visit | Attending: Internal Medicine | Admitting: Internal Medicine

## 2023-08-13 ENCOUNTER — Ambulatory Visit (HOSPITAL_COMMUNITY): Admitting: Internal Medicine

## 2023-08-13 ENCOUNTER — Encounter (HOSPITAL_COMMUNITY): Payer: Self-pay

## 2023-08-13 VITALS — BP 112/70 | HR 64 | Ht 64.0 in | Wt 223.0 lb

## 2023-08-13 DIAGNOSIS — D6869 Other thrombophilia: Secondary | ICD-10-CM

## 2023-08-13 DIAGNOSIS — I4891 Unspecified atrial fibrillation: Secondary | ICD-10-CM | POA: Diagnosis not present

## 2023-08-13 DIAGNOSIS — I480 Paroxysmal atrial fibrillation: Secondary | ICD-10-CM | POA: Diagnosis not present

## 2023-08-13 MED ORDER — APIXABAN 5 MG PO TABS: 5.0000 mg | ORAL_TABLET | Freq: Two times a day (BID) | ORAL | 6 refills | Status: DC

## 2023-08-13 NOTE — Telephone Encounter (Signed)
 Patient may hold Eliquis  2 days prior to dental procedure and resume as appropriate after per Norleen Heinrich P.A.

## 2023-08-13 NOTE — Progress Notes (Incomplete)
 Primary Care Physician: Default, Provider, MD Primary Cardiologist: Lynwood Schilling, MD Electrophysiologist: None  {Click to update primary MD,subspecialty MD or APP then REFRESH:1}   Referring Physician: Dr. Schilling  {Removed [facility], PMH, PSH, ALLERGY, CMED, and SOC :1}   Kristina Glover is a 63 y.o. female with a history of OSA, tobacco use, morbid obesity, T2DM, COPD, HLD, CAD, HTN, and atrial fibrillation who presents for consultation in the [facility]. ED visit on 08/01/23 for new onset Afib likely precipitated by not wearing CPAP due to temporary dental bridge and waiting on permanent bridge. consulted by Dr. Schilling started on Eliquis , amlodipine stopped, and metoprolol  increased to 100 mg daily. Converted to NSR while in ED. Patient is on Eliquis  5 mg BID for a CHADS2VASC score of 4.  On evaluation today, she is currently in ***.   Today, she denies symptoms of ***palpitations, chest pain, shortness of breath, orthopnea, PND, lower extremity edema, dizziness, presyncope, syncope, snoring, daytime somnolence, bleeding, or neurologic sequela. The patient is tolerating medications without difficulties and is otherwise without complaint today.    Atrial Fibrillation Risk Factors:  she does have symptoms or diagnosis of sleep apnea. she is compliant with CPAP therapy.  she has a BMI of There is no height or weight on file to calculate BMI.. There were no vitals filed for this visit.  Current Outpatient Medications  Medication Sig Dispense Refill   amLODipine (NORVASC) 2.5 MG tablet Take 2.5 mg by mouth every morning.     amphetamine-dextroamphetamine (ADDERALL XR) 20 MG 24 hr capsule Take 20 mg by mouth daily.     apixaban  (ELIQUIS ) 5 MG TABS tablet Take 1 tablet (5 mg total) by mouth 2 (two) times daily. 60 tablet 0   aspirin  81 MG chewable tablet Chew 81 mg by mouth daily.     buPROPion  (WELLBUTRIN  XL) 150 MG 24 hr tablet Take 150 mg by mouth daily.      Cholecalciferol (HM VITAMIN D3) 4000 units CAPS Take 4,000 Units by mouth daily.     Cyanocobalamin (B-12 COMPLIANCE INJECTION) 1000 MCG/ML KIT Inject 1,000 mcg as directed every 30 (thirty) days.     furosemide  (LASIX ) 20 MG tablet Take 20 mg by mouth daily as needed for fluid.      losartan  (COZAAR ) 100 MG tablet TAKE 1 TABLET BY MOUTH EVERY DAY IN THE MORNING     losartan -hydrochlorothiazide  (HYZAAR) 100-12.5 MG tablet Take 1 tablet by mouth daily.     methocarbamol  (ROBAXIN ) 500 MG tablet Take 1 tablet (500 mg total) by mouth 3 (three) times daily as needed for muscle spasms. 60 tablet 0   metoprolol  succinate (TOPROL -XL) 50 MG 24 hr tablet Take 50 mg by mouth daily.      mometasone (NASONEX) 50 MCG/ACT nasal spray Place 1 spray into the nose daily.      MOUNJARO 7.5 MG/0.5ML Pen SMARTSIG:7.5 Milligram(s) SUB-Q Once a Week     nitroGLYCERIN  (NITRO-DUR ) 0.2 mg/hr patch Place 1 patch (0.2 mg total) onto the skin daily. 30 patch 12   rosuvastatin (CRESTOR) 5 MG tablet Take 5 mg by mouth once a week.     sertraline  (ZOLOFT ) 100 MG tablet Take 200 mg by mouth daily.      No current facility-administered medications for this visit.    Atrial Fibrillation Management history:  Previous antiarrhythmic drugs: none Previous cardioversions: none Previous ablations: none Anticoagulation history: Eliquis    ROS- All systems are reviewed and negative except as  per the HPI above.  Physical Exam: There were no vitals taken for this visit.  GEN: Well nourished, well developed in no acute distress NECK: No JVD; No carotid bruits CARDIAC: {EPRHYTHM:[id number]}, no murmurs, rubs, gallops RESPIRATORY:  Clear to auscultation without rales, wheezing or rhonchi  ABDOMEN: Soft, non-tender, non-distended EXTREMITIES:  No edema; No deformity   EKG today demonstrates ***  Echo scheduled for 8/11.  ASSESSMENT & PLAN CHA2DS2-VASc Score = 4  The patient's score is based upon: CHF History: 0 HTN History:  1 Diabetes History: 1 Stroke History: 0 Vascular Disease History: 1 Age Score: 0 Gender Score: 1   {Confirm score is correct.  If not, click here to update score.  REFRESH note.  :1}    ASSESSMENT AND PLAN: {Select the correct AFib Diagnosis                 :[id number]}  PAF    Follow up ***   Terra Pac, PA-C  Afib Clinic [facility] [address] [place], [place] [address] [phone]

## 2023-08-13 NOTE — Progress Notes (Signed)
 Primary Care Physician: Default, Provider, MD Primary Cardiologist: Lynwood Schilling, MD Electrophysiologist: None     Referring Physician: Dr. Schilling Kristina Glover is a 63 y.o. female with a history of OSA, tobacco use, morbid obesity, T2DM, COPD, HLD, CAD, HTN, and atrial fibrillation who presents for consultation in the [facility]. ED visit on 08/01/23 for new onset Afib likely precipitated by not wearing CPAP due to temporary dental bridge and waiting on permanent bridge. Consulted in ED by Dr. Schilling - started on Eliquis . Converted to NSR while in ED. Patient is on Eliquis  5 mg BID for a CHADS2VASC score of 4.  On evaluation today, she is currently in NSR. She has noted intermittent palpitations. She resumed her CPAP treatment the night of the ED visit. No missed doses of Eliquis .  Today, she denies symptoms of chest pain, shortness of breath, orthopnea, PND, lower extremity edema, dizziness, presyncope, syncope, snoring, daytime somnolence, bleeding, or neurologic sequela. The patient is tolerating medications without difficulties and is otherwise without complaint today.    Atrial Fibrillation Risk Factors:  she does have symptoms or diagnosis of sleep apnea. she is compliant with CPAP therapy.  she has a BMI of Body mass index is 38.28 kg/m.SABRA Filed Weights   08/13/23 0913  Weight: 101.2 kg    Current Outpatient Medications  Medication Sig Dispense Refill   ADDERALL XR 10 MG 24 hr capsule 1 capsule in the morning Orally...may fill 30 days after last refill Once a day; Duration: 30 days     albuterol (VENTOLIN HFA) 108 (90 Base) MCG/ACT inhaler Inhale 2 puffs into the lungs as needed for wheezing or shortness of breath.     amLODipine (NORVASC) 2.5 MG tablet Take 2.5 mg by mouth every morning.     buPROPion  (WELLBUTRIN  XL) 150 MG 24 hr tablet Take 150 mg by mouth daily.     Cholecalciferol (HM VITAMIN D3) 4000 units CAPS Take 4,000 Units  by mouth daily. (Patient taking differently: Taking 2 gummies by mouth daily- total 4000 Units)     Cyanocobalamin (B-12 COMPLIANCE INJECTION) 1000 MCG/ML KIT Inject 1,000 mcg as directed every 30 (thirty) days.     hydrochlorothiazide  (HYDRODIURIL ) 25 MG tablet TAKE 1 TABLET BY MOUTH EVERY MORNING; Duration: 90 days     loratadine (CLARITIN) 10 MG tablet Take 10 mg by mouth daily.     losartan  (COZAAR ) 100 MG tablet TAKE 1 TABLET BY MOUTH EVERY DAY IN THE MORNING     metoprolol  succinate (TOPROL -XL) 100 MG 24 hr tablet Take 100 mg by mouth every morning.     mometasone (NASONEX) 50 MCG/ACT nasal spray Place 1 spray into the nose daily.  (Patient taking differently: Place 1 spray into the nose as needed.)     MOUNJARO 7.5 MG/0.5ML Pen SMARTSIG:7.5 Milligram(s) SUB-Q Once a Week     sertraline  (ZOLOFT ) 100 MG tablet Take 200 mg by mouth daily.  (Patient taking differently: Taking 1.5 tablets by mouth daily)     amphetamine-dextroamphetamine (ADDERALL XR) 20 MG 24 hr capsule Take 20 mg by mouth daily. (Patient not taking: Reported on 08/13/2023)     apixaban  (ELIQUIS ) 5 MG TABS tablet Take 1 tablet (5 mg total) by mouth 2 (two) times daily. 60 tablet 6   No current facility-administered medications for this encounter.    Atrial Fibrillation Management history:  Previous antiarrhythmic drugs: none Previous cardioversions: none Previous ablations: none Anticoagulation history: Eliquis    ROS-  All systems are reviewed and negative except as per the HPI above.  Physical Exam: BP 112/70   Pulse 64   Ht 5' 4 (1.626 m)   Wt 101.2 kg   BMI 38.28 kg/m   GEN: Well nourished, well developed in no acute distress NECK: No JVD; No carotid bruits CARDIAC: Regular rate and rhythm, no murmurs, rubs, gallops RESPIRATORY:  Clear to auscultation without rales, wheezing or rhonchi  ABDOMEN: Soft, non-tender, non-distended EXTREMITIES:  No edema; No deformity   EKG today demonstrates  Vent. rate 64  BPM PR interval 136 ms QRS duration 74 ms QT/QTcB 398/410 ms P-R-T axes 7 74 82 Normal sinus rhythm Nonspecific ST abnormality Abnormal ECG When compared with ECG of 01-Aug-2023 13:35, PREVIOUS ECG IS PRESENT  Echo scheduled for 8/11.  ASSESSMENT & PLAN CHA2DS2-VASc Score = 4  The patient's score is based upon: CHF History: 0 HTN History: 1 Diabetes History: 1 Stroke History: 0 Vascular Disease History: 1 Age Score: 0 Gender Score: 1       ASSESSMENT AND PLAN: Paroxysmal Atrial Fibrillation (ICD10:  I48.0) The patient's CHA2DS2-VASc score is 4, indicating a 4.8% annual risk of stroke.    She is currently in NSR. Continue Toprol  100 mg daily. Education provided about Afib. Discussion about triggers for Afib. Will place Zio monitor for 2 weeks to assess PAF burden. Briefly discussed treatments going forward if indicated. After discussion, we will proceed with conservative observation at this time. Rhythm monitoring device recommended specifically to upgrade Apple watch to series 4 or later.     Secondary Hypercoagulable State (ICD10:  D68.69) The patient is at significant risk for stroke/thromboembolism based upon her CHA2DS2-VASc Score of 4.  Continue Apixaban  (Eliquis ).  Continue Eliquis  5 mg BID.     Follow up will be based on monitor results.   Terra Pac, PA-C  Af[facility] Clinic [facility] [address] [place], [place] [address] [phone]

## 2023-09-01 ENCOUNTER — Other Ambulatory Visit: Payer: Self-pay

## 2023-09-01 ENCOUNTER — Encounter: Payer: Self-pay | Admitting: Cardiology

## 2023-09-01 MED ORDER — APIXABAN 5 MG PO TABS: 5.0000 mg | ORAL_TABLET | Freq: Two times a day (BID) | ORAL | 6 refills | Status: DC

## 2023-09-01 NOTE — Telephone Encounter (Signed)
 Prescription refill request for Eliquis  received. Indication:afib Last office visit:7/25 Scr:0.66  6/25 Age: 63 Weight:101.2  kg  Prescription refilled

## 2023-09-03 ENCOUNTER — Other Ambulatory Visit: Payer: Self-pay | Admitting: Family Medicine

## 2023-09-03 DIAGNOSIS — Z1231 Encounter for screening mammogram for malignant neoplasm of breast: Secondary | ICD-10-CM

## 2023-09-04 ENCOUNTER — Inpatient Hospital Stay
Admission: RE | Admit: 2023-09-04 | Discharge: 2023-09-04 | Discharge status: Home / Self Care | Source: Ambulatory Visit | Attending: Family Medicine | Admitting: Family Medicine

## 2023-09-04 DIAGNOSIS — Z1231 Encounter for screening mammogram for malignant neoplasm of breast: Secondary | ICD-10-CM

## 2023-09-05 ENCOUNTER — Ambulatory Visit (HOSPITAL_COMMUNITY): Payer: Self-pay | Admitting: Internal Medicine

## 2023-09-09 ENCOUNTER — Other Ambulatory Visit: Payer: Self-pay | Admitting: Family Medicine

## 2023-09-09 DIAGNOSIS — R928 Other abnormal and inconclusive findings on diagnostic imaging of breast: Secondary | ICD-10-CM

## 2023-09-15 ENCOUNTER — Other Ambulatory Visit (HOSPITAL_COMMUNITY)

## 2023-09-24 ENCOUNTER — Other Ambulatory Visit: Payer: Self-pay | Admitting: Medical Genetics

## 2023-09-25 ENCOUNTER — Ambulatory Visit (HOSPITAL_COMMUNITY)
Admission: RE | Admit: 2023-09-25 | Discharge: 2023-09-25 | Disposition: A | Discharge status: Home / Self Care | Source: Ambulatory Visit | Attending: Cardiology | Admitting: Cardiology

## 2023-09-25 DIAGNOSIS — I4892 Unspecified atrial flutter: Secondary | ICD-10-CM | POA: Insufficient documentation

## 2023-09-25 LAB — ECHOCARDIOGRAM COMPLETE: S' Lateral: 2.64 cm

## 2023-09-26 ENCOUNTER — Ambulatory Visit
Admission: RE | Admit: 2023-09-26 | Discharge: 2023-09-26 | Disposition: A | Discharge status: Home / Self Care | Source: Ambulatory Visit | Attending: Family Medicine | Admitting: Family Medicine

## 2023-09-26 DIAGNOSIS — R928 Other abnormal and inconclusive findings on diagnostic imaging of breast: Secondary | ICD-10-CM

## 2023-09-30 ENCOUNTER — Ambulatory Visit: Payer: Self-pay | Admitting: Student

## 2023-10-02 ENCOUNTER — Other Ambulatory Visit (HOSPITAL_COMMUNITY)
Admission: RE | Admit: 2023-10-02 | Discharge: 2023-10-02 | Disposition: A | Discharge status: Home / Self Care | Payer: Self-pay | Source: Ambulatory Visit | Attending: Medical Genetics | Admitting: Medical Genetics

## 2023-10-09 ENCOUNTER — Observation Stay (HOSPITAL_COMMUNITY)
Admission: EM | Admit: 2023-10-09 | Discharge: 2023-10-10 | Disposition: A | Discharge status: Home / Self Care | Attending: Internal Medicine | Admitting: Internal Medicine

## 2023-10-09 ENCOUNTER — Encounter (HOSPITAL_COMMUNITY): Payer: Self-pay | Admitting: Internal Medicine

## 2023-10-09 ENCOUNTER — Other Ambulatory Visit: Payer: Self-pay

## 2023-10-09 ENCOUNTER — Observation Stay (HOSPITAL_COMMUNITY)

## 2023-10-09 DIAGNOSIS — F32A Depression, unspecified: Secondary | ICD-10-CM | POA: Insufficient documentation

## 2023-10-09 DIAGNOSIS — E876 Hypokalemia: Secondary | ICD-10-CM | POA: Insufficient documentation

## 2023-10-09 DIAGNOSIS — F419 Anxiety disorder, unspecified: Secondary | ICD-10-CM | POA: Insufficient documentation

## 2023-10-09 DIAGNOSIS — E119 Type 2 diabetes mellitus without complications: Secondary | ICD-10-CM | POA: Diagnosis not present

## 2023-10-09 DIAGNOSIS — R531 Weakness: Secondary | ICD-10-CM

## 2023-10-09 DIAGNOSIS — R0981 Nasal congestion: Secondary | ICD-10-CM | POA: Diagnosis not present

## 2023-10-09 DIAGNOSIS — Z7901 Long term (current) use of anticoagulants: Secondary | ICD-10-CM | POA: Diagnosis not present

## 2023-10-09 DIAGNOSIS — Z6835 Body mass index (BMI) 35.0-35.9, adult: Secondary | ICD-10-CM | POA: Diagnosis not present

## 2023-10-09 DIAGNOSIS — I480 Paroxysmal atrial fibrillation: Principal | ICD-10-CM | POA: Insufficient documentation

## 2023-10-09 DIAGNOSIS — I10 Essential (primary) hypertension: Secondary | ICD-10-CM | POA: Diagnosis not present

## 2023-10-09 DIAGNOSIS — Z87891 Personal history of nicotine dependence: Secondary | ICD-10-CM | POA: Diagnosis not present

## 2023-10-09 DIAGNOSIS — E66813 Obesity, class 3: Secondary | ICD-10-CM | POA: Insufficient documentation

## 2023-10-09 DIAGNOSIS — I2510 Atherosclerotic heart disease of native coronary artery without angina pectoris: Secondary | ICD-10-CM | POA: Insufficient documentation

## 2023-10-09 DIAGNOSIS — R059 Cough, unspecified: Secondary | ICD-10-CM | POA: Insufficient documentation

## 2023-10-09 DIAGNOSIS — R519 Headache, unspecified: Secondary | ICD-10-CM | POA: Diagnosis present

## 2023-10-09 DIAGNOSIS — Z79899 Other long term (current) drug therapy: Secondary | ICD-10-CM | POA: Insufficient documentation

## 2023-10-09 DIAGNOSIS — I2583 Coronary atherosclerosis due to lipid rich plaque: Secondary | ICD-10-CM

## 2023-10-09 DIAGNOSIS — G4733 Obstructive sleep apnea (adult) (pediatric): Secondary | ICD-10-CM | POA: Diagnosis not present

## 2023-10-09 DIAGNOSIS — E66812 Obesity, class 2: Secondary | ICD-10-CM | POA: Diagnosis present

## 2023-10-09 DIAGNOSIS — I4891 Unspecified atrial fibrillation: Principal | ICD-10-CM | POA: Diagnosis present

## 2023-10-09 LAB — BASIC METABOLIC PANEL WITH GFR
Anion gap: 16 — ABNORMAL HIGH (ref 5–15)
BUN: 9 mg/dL (ref 8–23)
CO2: 19 mmol/L — ABNORMAL LOW (ref 22–32)
Calcium: 9.2 mg/dL (ref 8.9–10.3)
Chloride: 110 mmol/L (ref 98–111)
Creatinine, Ser: 0.81 mg/dL (ref 0.44–1.00)
GFR, Estimated: >60 mL/min (ref >60)
Glucose, Bld: 133 mg/dL — ABNORMAL HIGH (ref 70–99)
Potassium: 3.7 mmol/L (ref 3.5–5.1)
Sodium: 145 mmol/L (ref 135–145)

## 2023-10-09 LAB — HIV ANTIBODY (ROUTINE TESTING W REFLEX): HIV Screen 4th Generation wRfx: NONREACTIVE

## 2023-10-09 LAB — CBC
HCT: 38.9 % (ref 36.0–46.0)
Hemoglobin: 13 g/dL (ref 12.0–15.0)
MCH: 30 pg (ref 26.0–34.0)
MCHC: 33.4 g/dL (ref 30.0–36.0)
MCV: 89.8 fL (ref 80.0–100.0)
Platelets: 246 K/uL (ref 150–400)
RBC: 4.33 MIL/uL (ref 3.87–5.11)
RDW: 13.8 % (ref 11.5–15.5)
WBC: 9.6 K/uL (ref 4.0–10.5)
nRBC: 0 % (ref 0.0–0.2)

## 2023-10-09 LAB — MAGNESIUM: Magnesium: 2 mg/dL (ref 1.7–2.4)

## 2023-10-09 LAB — TROPONIN I (HIGH SENSITIVITY): Troponin I (High Sensitivity): 7 ng/L (ref <18)

## 2023-10-09 MED ORDER — LORATADINE 10 MG PO TABS
10.0000 mg | ORAL_TABLET | Freq: Every day | ORAL | Status: DC
  Administered 2023-10-09 – 2023-10-10 (×2): 10 mg via ORAL
  Filled 2023-10-09 (×2): qty 1

## 2023-10-09 MED ORDER — ENOXAPARIN SODIUM 40 MG/0.4ML IJ SOSY: 40.0000 mg | PREFILLED_SYRINGE | INTRAMUSCULAR | Status: DC

## 2023-10-09 MED ORDER — SERTRALINE HCL 100 MG PO TABS
300.0000 mg | ORAL_TABLET | Freq: Every day | ORAL | Status: DC
  Administered 2023-10-09 – 2023-10-10 (×2): 300 mg via ORAL
  Filled 2023-10-09 (×2): qty 3

## 2023-10-09 MED ORDER — AMLODIPINE BESYLATE 2.5 MG PO TABS: 2.5000 mg | ORAL_TABLET | Freq: Every morning | ORAL | Status: DC

## 2023-10-09 MED ORDER — ALBUTEROL SULFATE (2.5 MG/3ML) 0.083% IN NEBU: 2.5000 mg | INHALATION_SOLUTION | Freq: Four times a day (QID) | RESPIRATORY_TRACT | Status: DC | PRN

## 2023-10-09 MED ORDER — TRAMADOL HCL 50 MG PO TABS: 50.0000 mg | ORAL_TABLET | Freq: Three times a day (TID) | ORAL | Status: DC | PRN

## 2023-10-09 MED ORDER — ACETAMINOPHEN 325 MG PO TABS
650.0000 mg | ORAL_TABLET | Freq: Four times a day (QID) | ORAL | Status: DC | PRN
  Administered 2023-10-09 – 2023-10-10 (×2): 650 mg via ORAL
  Filled 2023-10-09 (×2): qty 2

## 2023-10-09 MED ORDER — SODIUM CHLORIDE 0.9% FLUSH
3.0000 mL | Freq: Two times a day (BID) | INTRAVENOUS | Status: DC
  Administered 2023-10-09 – 2023-10-10 (×2): 3 mL via INTRAVENOUS

## 2023-10-09 MED ORDER — ONDANSETRON HCL 4 MG/2ML IJ SOLN: 4.0000 mg | Freq: Four times a day (QID) | INTRAMUSCULAR | Status: DC | PRN

## 2023-10-09 MED ORDER — ACETAMINOPHEN 650 MG RE SUPP: 650.0000 mg | Freq: Four times a day (QID) | RECTAL | Status: DC | PRN

## 2023-10-09 MED ORDER — ONDANSETRON HCL 4 MG PO TABS: 4.0000 mg | ORAL_TABLET | Freq: Four times a day (QID) | ORAL | Status: DC | PRN

## 2023-10-09 MED ORDER — BUPROPION HCL ER (XL) 150 MG PO TB24
150.0000 mg | ORAL_TABLET | Freq: Every day | ORAL | Status: DC
  Administered 2023-10-09 – 2023-10-10 (×2): 150 mg via ORAL
  Filled 2023-10-09 (×2): qty 1

## 2023-10-09 MED ORDER — DILTIAZEM LOAD VIA INFUSION
10.0000 mg | Freq: Once | INTRAVENOUS | Status: AC
  Administered 2023-10-09: 10 mg via INTRAVENOUS
  Filled 2023-10-09: qty 10

## 2023-10-09 MED ORDER — APIXABAN 5 MG PO TABS
5.0000 mg | ORAL_TABLET | Freq: Two times a day (BID) | ORAL | Status: DC
  Administered 2023-10-09 – 2023-10-10 (×3): 5 mg via ORAL
  Filled 2023-10-09 (×3): qty 1

## 2023-10-09 MED ORDER — DILTIAZEM HCL-DEXTROSE 125-5 MG/125ML-% IV SOLN (PREMIX)
5.0000 mg/h | INTRAVENOUS | Status: DC
  Administered 2023-10-09: 5 mg/h via INTRAVENOUS
  Administered 2023-10-09: 15 mg/h via INTRAVENOUS
  Filled 2023-10-09 (×3): qty 125

## 2023-10-09 MED ORDER — FLUTICASONE PROPIONATE 50 MCG/ACT NA SUSP
1.0000 | Freq: Every day | NASAL | Status: DC
  Filled 2023-10-09: qty 16

## 2023-10-09 MED ORDER — HYDROCHLOROTHIAZIDE 25 MG PO TABS
25.0000 mg | ORAL_TABLET | Freq: Every day | ORAL | Status: DC
  Administered 2023-10-09: 25 mg via ORAL
  Filled 2023-10-09 (×2): qty 1

## 2023-10-09 MED ORDER — LOSARTAN POTASSIUM 50 MG PO TABS
100.0000 mg | ORAL_TABLET | Freq: Every day | ORAL | Status: DC
  Administered 2023-10-09 – 2023-10-10 (×2): 100 mg via ORAL
  Filled 2023-10-09 (×2): qty 2

## 2023-10-09 NOTE — ED Provider Notes (Addendum)
 Signed out that pt with afib/rvr, generally weak, some non-compliance with anticoag therapy, and will need admission.  Medicine consulted for admission. (Pt indicates previous pcp was Dr Gib, but he had retired last year, and so has no pcp currently, is looking for new one).   Cardiology consulted re afib management.      Bernard Drivers, MD 10/09/23 [phone]

## 2023-10-09 NOTE — H&P (Addendum)
 History and Physical    Patient: Kristina Glover FMW:[id number] DOB: 07/20/1960 DOA: 10/09/2023 DOS: the patient was seen and examined on 10/09/2023 PCP: Pcp, No  Patient coming from: Home  Chief Complaint:  Chief Complaint  Patient presents with   Atrial Fibrillation   HPI: Kristina Glover is a 63 y.o. female with medical history significant of hypertension, paroxysmal A-fib, anxiety, depression, and OSA presents with chest pressure and palpitations.  She experienced an episode of atrial fibrillation at approximately 2:00 AM, characterized by chest pressure and a rapid, irregular heartbeat. Initially, she used her watch to perform an EKG, which was normal, but a subsequent EKG indicated atrial fibrillation. She recalls a similar episode in [place] where taking Tums and belching helped her heart return to normal rhythm, but this time the Tums did not work. She considered driving but felt her heart rate was too erratic.  Throughout the week, she has experienced shortness of breath. No nausea, vomiting, or fever. She is unsure about leg swelling, stating her legs are probably swollen but also attributing it to being 'fat.' She has had a headache for three days, which she attributes to sinus issues, and a stopped-up nose.  She is currently on Eliquis  but missed two or three doses in the past week due to a disrupted schedule. She states she is usually very compliant with her medication regimen.  In in the ED patient was noted to be afebrile with heart rates elevated up to 130s with respirations elevated up to 27 and all other vital signs relatively maintained.  Labs significant for potassium 3.7, CO2 19, glucose 133, anion gap 16, and high-sensitivity troponin negative.  Patient been given Cardizem  10 mg IV and then started on a Cardizem  drip.  Review of Systems: As mentioned in the history of present illness. All other systems reviewed and are negative. Past Medical History:  Diagnosis Date    Anxiety    Arthritis    Depression    Fibrocystic breast disease    Headache    MIGRAINES     Hypertension    OSA (obstructive sleep apnea)    CPAP   USES AT NIGHT   PONV (postoperative nausea and vomiting)    RLS (restless legs syndrome)    Past Surgical History:  Procedure Laterality Date   ANTERIOR CERVICAL DECOMP/DISCECTOMY FUSION N/A 06/08/2015   Procedure: ANTERIOR CERVICAL DISCECTOMY FUSION C4-C5, C5-C6     (2 LEVELS) ;  Surgeon: Donaciano Sprang, MD;  Location: [facility] OR;  Service: Orthopedics;  Laterality: N/A;   cyst removed     on hand x 2   scar tissue removed     right foot   SHOULDER SURGERY     right   TOTAL ABDOMINAL HYSTERECTOMY     Social History:  reports that she quit smoking about 11 years ago. Her smoking use included cigarettes. She started smoking about 46 years ago. She has a 35 pack-year smoking history. She does not have any smokeless tobacco history on file. She reports that she does not drink alcohol and does not use drugs.  Allergies  Allergen Reactions   Sulfamethoxazole Shortness Of Breath   Gabapentin     Other Reaction(s): respiratory distress   Lisinopril     Other Reaction(s): dizziness, dizziness, tinnitus   Methylphenidate Other (See Comments)    Other Reaction(s): heart racing, irregular heart rate  Ritalin   Adhesive [Tape] Other (See Comments)    Blisters   Aripiprazole  Other Reaction(s): did not work   Citalopram     Other Reaction(s): did not work   Cyanocobalamin     Other Reaction(s): oral only causes diarrhea   Imodium [Loperamide] Hives   Levofloxacin Other (See Comments)    Unknown   Losartan  Potassium-Hctz     Other Reaction(s): off balance   Prednisone Swelling    She is not able to tolerate the oral prednisone- causes soreness to the touch - within her skin    Semaglutide     Other Reaction(s): Depression   Sulfamethoxazole-Trimethoprim     Other Reaction(s): hives   Sulfonamide Derivatives Other (See Comments)     Dehydration   Sulfur Dioxide     Other Reaction(s): Not available   Varenicline     Other Reaction(s): depression   Venlafaxine     Other Reaction(s): did not work   Diphenhydramine Hcl Palpitations    Heart race    Family History  Problem Relation Age of Onset   Heart disease Mother    Emphysema Father    Cancer Father        unsure what kind   Allergies Sister    Heart disease Sister    Breast cancer Maternal Aunt    Breast cancer Maternal Aunt    Breast cancer Maternal Aunt     Prior to Admission medications   Medication Sig Start Date End Date Taking? Authorizing Provider  ADDERALL XR 10 MG 24 hr capsule 1 capsule in the morning Orally...may fill 30 days after last refill Once a day; Duration: 30 days 12/27/22   [provider]  albuterol  (VENTOLIN  HFA) 108 (90 Base) MCG/ACT inhaler Inhale 2 puffs into the lungs as needed for wheezing or shortness of breath.    [provider]  amLODipine  (NORVASC ) 2.5 MG tablet Take 2.5 mg by mouth every morning. 10/23/22   [provider]  amphetamine-dextroamphetamine (ADDERALL XR) 20 MG 24 hr capsule Take 20 mg by mouth daily. Patient not taking: Reported on 08/13/2023 04/19/15   [provider]  apixaban  (ELIQUIS ) 5 MG TABS tablet Take 1 tablet (5 mg total) by mouth 2 (two) times daily. 09/01/23   Lavona Agent, MD  buPROPion  (WELLBUTRIN  XL) 150 MG 24 hr tablet Take 150 mg by mouth daily.    [provider]  Cholecalciferol (HM VITAMIN D3) 4000 units CAPS Take 4,000 Units by mouth daily. Patient taking differently: Taking 2 gummies by mouth daily- total 4000 Units    [provider]  Cyanocobalamin (B-12 COMPLIANCE INJECTION) 1000 MCG/ML KIT Inject 1,000 mcg as directed every 30 (thirty) days.    [provider]  hydrochlorothiazide  (HYDRODIURIL ) 25 MG tablet TAKE 1 TABLET BY MOUTH EVERY MORNING; Duration: 90 days    [provider]  loratadine  (CLARITIN ) 10 MG tablet  Take 10 mg by mouth daily.    [provider]  losartan  (COZAAR ) 100 MG tablet TAKE 1 TABLET BY MOUTH EVERY DAY IN THE MORNING    [provider]  metoprolol  succinate (TOPROL -XL) 100 MG 24 hr tablet Take 100 mg by mouth every morning.    [provider]  mometasone (NASONEX) 50 MCG/ACT nasal spray Place 1 spray into the nose daily.  Patient taking differently: Place 1 spray into the nose as needed. 03/24/15   [provider]  MOUNJARO 7.5 MG/0.5ML Pen SMARTSIG:7.5 Milligram(s) SUB-Q Once a Week 11/15/22   [provider]  sertraline  (ZOLOFT ) 100 MG tablet Take 200 mg by mouth daily.  Patient taking differently: Taking 1.5 tablets by mouth daily    [provider]    Physical Exam: Vitals:   10/09/23 0854 10/09/23 0900 10/09/23 0910 10/09/23 0910  BP:  103/71    Pulse: 88 (!) 112  (!) 132  Resp: (!) 25 (!) 25 (!) 21   Temp:      TempSrc:      SpO2: 96% 97% 95%   Weight:      Height:        Constitutional: Obese female currently in NAD, calm, comfortable Eyes: PERRL, lids and conjunctivae normal ENMT: Mucous membranes are moist. Normal dentition.  Neck: normal, supple  Respiratory: clear to auscultation bilaterally, no wheezing, no crackles. Normal respiratory effort. No accessory muscle use.  Cardiovascular: Irregular irregular.  Trace lower extremity edema. 2+ pedal pulses.  Abdomen: no tenderness, no masses palpated. Bowel sounds positive.  Musculoskeletal: no clubbing / cyanosis. No joint deformity upper and lower extremities. Good ROM, no contractures. Normal muscle tone.  Skin: no rashes, lesions, ulcers. No induration Neurologic: CN 2-12 grossly intact.  Strength 5/5 in all 4.  Psychiatric: Normal judgment and insight. Alert and oriented x 3. Normal mood.   Data Reviewed:  EKG reveals atrial fibrillation at 123 bpm.  Reviewed labs, imaging, and pertinent records as documented.  Assessment and Plan:  Paroxysmal atrial  fibrillation Patient presented with complaints of chest pressure and palpitations.  Patient found to be in A-fib heart rates into the 130s. CHA2DS2-VASc score equal to at least 4.  She admitted to intermittently missing a couple doses of Eliquis  recently. - Admit to a cardiac telemetry bed - Goal potassium at least 4 and magnesium at least 2 - Continue Cardizem  drip - Continue Eliquis  - Cardiology consulted, will follow-up for any further recommendations  Essential hypertension Blood pressures were currently maintained. - Continue hydrochlorothiazide  and losartan  - Initially held metoprolol  and amlodipine  while on Cardizem  drip  CAD Patient had mild to moderate CAD on cardiac CT with coronary calcium score of 11.92 when done back in 2013.  Anxiety and depression - Continue Wellbutrin  and Zoloft   OSA on CPAP - Continue CPAP nightly  Obesity, class II BMI 37.42 kg/m.  Patient is on Mounjaro in the outpatient setting.   DVT prophylaxis: Eliquis  Advance Care Planning:   Code Status: Full Code    Consults: cards  Family Communication: none  Severity of Illness: The appropriate patient status for this patient is OBSERVATION. Observation status is judged to be reasonable and necessary in order to provide the required intensity of service to ensure the patient's safety. The patient's presenting symptoms, physical exam findings, and initial radiographic and laboratory data in the context of their medical condition is felt to place them at decreased risk for further clinical deterioration. Furthermore, it is anticipated that the patient will be medically stable for discharge from the hospital within 2 midnights of admission.   Author: Maximino DELENA Sharps, MD 10/09/2023 9:21 AM  For on call review [email or web address].

## 2023-10-09 NOTE — ED Provider Notes (Signed)
 [facility]GENC[facility]  [facility]OSPITAL Provider Note   CSN: [id number] Arrival date & time: 10/09/23  9460     Patient presents with: Atrial Fibrillation   Kristina Glover is a 63 y.o. female.   The history is provided by the patient, the EMS personnel and medical records.  Atrial Fibrillation  Kristina Glover is a 63 y.o. female who presents to the Emergency Department complaining of atrial fibrillation. She presents the emergency department by EMS for evaluation of a fib that started around two in the morning when she was trying to sleep. She does report that she has not felt well for the last 24 hours with headache, runny nose and congestion. She does report mild cough cholesteryl days. No fever, chest pain, difficulty breathing. She does have nausea but just took her Monjaro. She reports having some indigestion. She does have a history of paroxysmal atrial fibrillation and is compliant with her medications. She did miss her HCTZ yesterday. She does forget to take her eliquis  1-2 times per week.       Prior to Admission medications   Medication Sig Start Date End Date Taking? Authorizing Provider  ADDERALL XR 10 MG 24 hr capsule 1 capsule in the morning Orally...may fill 30 days after last refill Once a day; Duration: 30 days 12/27/22   [provider]  albuterol  (VENTOLIN  HFA) 108 (90 Base) MCG/ACT inhaler Inhale 2 puffs into the lungs as needed for wheezing or shortness of breath.    [provider]  amLODipine  (NORVASC ) 2.5 MG tablet Take 2.5 mg by mouth every morning. 10/23/22   [provider]  amphetamine-dextroamphetamine (ADDERALL XR) 20 MG 24 hr capsule Take 20 mg by mouth daily. Patient not taking: Reported on 08/13/2023 04/19/15   [provider]  apixaban  (ELIQUIS ) 5 MG TABS tablet Take 1 tablet (5 mg total) by mouth 2 (two) times daily. 09/01/23   Lavona Agent, MD  buPROPion  (WELLBUTRIN  XL) 150 MG 24 hr tablet Take 150 mg by  mouth daily.    [provider]  Cholecalciferol (HM VITAMIN D3) 4000 units CAPS Take 4,000 Units by mouth daily. Patient taking differently: Taking 2 gummies by mouth daily- total 4000 Units    [provider]  Cyanocobalamin (B-12 COMPLIANCE INJECTION) 1000 MCG/ML KIT Inject 1,000 mcg as directed every 30 (thirty) days.    [provider]  hydrochlorothiazide  (HYDRODIURIL ) 25 MG tablet TAKE 1 TABLET BY MOUTH EVERY MORNING; Duration: 90 days    [provider]  loratadine  (CLARITIN ) 10 MG tablet Take 10 mg by mouth daily.    [provider]  losartan  (COZAAR ) 100 MG tablet TAKE 1 TABLET BY MOUTH EVERY DAY IN THE MORNING    [provider]  metoprolol  succinate (TOPROL -XL) 100 MG 24 hr tablet Take 100 mg by mouth every morning.    [provider]  mometasone (NASONEX) 50 MCG/ACT nasal spray Place 1 spray into the nose daily.  Patient taking differently: Place 1 spray into the nose as needed. 03/24/15   [provider]  MOUNJARO 7.5 MG/0.5ML Pen SMARTSIG:7.5 Milligram(s) SUB-Q Once a Week 11/15/22   [provider]  sertraline  (ZOLOFT ) 100 MG tablet Take 200 mg by mouth daily.  Patient taking differently: Taking 1.5 tablets by mouth daily    [provider]    Allergies: Sulfamethoxazole, Gabapentin, Lisinopril, Methylphenidate, Adhesive [tape], Aripiprazole, Citalopram, Cyanocobalamin, Imodium [loperamide], Levofloxacin, Losartan  potassium-hctz, Prednisone, Semaglutide, Sulfamethoxazole-trimethoprim, Sulfonamide derivatives, Sulfur dioxide, Varenicline, Venlafaxine, and  Diphenhydramine hcl    Review of Systems  All other systems reviewed and are negative.   Updated Vital Signs BP 115/81 (BP Location: Left Arm)   Pulse 90   Temp 98.7 F (37.1 C) (Oral)   Resp 20   Ht 5' 4 (1.626 m)   Wt 98.9 kg   SpO2 98%   BMI 37.42 kg/m   Physical Exam Vitals and nursing note reviewed.  Constitutional:       Appearance: She is well-developed.  HENT:     Head: Normocephalic and atraumatic.  Cardiovascular:     Rate and Rhythm: Tachycardia present. Rhythm irregular.     Heart sounds: No murmur heard. Pulmonary:     Effort: Pulmonary effort is normal. No respiratory distress.     Breath sounds: Normal breath sounds.  Abdominal:     Palpations: Abdomen is soft.     Tenderness: There is no abdominal tenderness. There is no guarding or rebound.  Musculoskeletal:        General: No tenderness.     Comments: Trace pitting edema to bilateral lower extremities  Skin:    General: Skin is warm and dry.  Neurological:     Mental Status: She is alert and oriented to person, place, and time.  Psychiatric:        Behavior: Behavior normal.     (all labs ordered are listed, but only abnormal results are displayed) Labs Reviewed  BASIC METABOLIC PANEL WITH GFR  CBC  MAGNESIUM  TROPONIN I (HIGH SENSITIVITY)    EKG: EKG Interpretation Date/Time:  Thursday October 09 2023 05:52:25 EDT Ventricular Rate:  123 PR Interval:    QRS Duration:  80 QT Interval:  350 QTC Calculation: 501 R Axis:   80  Text Interpretation: Atrial fibrillation Borderline repolarization abnormality Prolonged QT interval Confirmed by Griselda Norris [phone]) on 10/09/2023 5:56:14 AM  Radiology: No results found.   Procedures   Medications Ordered in the ED - No data to display                                  Medical Decision Making Amount and/or Complexity of Data Reviewed Labs: ordered.   Pt with hx/o paroxysmal afib on anticoagulation here for evaluation of palpitations, in atrial fibrillation, received cardizem  prior to ED arrival.  Patient care transferred pending labs.       Final diagnoses:  None    ED Discharge Orders     None          Griselda Norris, MD 10/09/23 [phone]

## 2023-10-09 NOTE — Consult Note (Signed)
 Cardiology Consultation   Patient ID: Kristina Glover MRN: [id number]; DOB: [birth year]  Admit date: 10/09/2023 Date of Consult: 10/09/2023  PCP:  [name]   [facility] Providers Cardiologist:  Lynwood Schilling, MD     Patient Profile: ANNSLEY Glover is a 63 y.o. female with a hx of htn, OSA, tobacco use, obesity, COPD, DM, CAD, and pAfib who is being seen 10/09/2023 for the evaluation of afib with RVR at the request of Dr. Claudene.  History of Present Illness: [name] presented earlier today with chest pain and palpitations and found to be in afib. She was started on diltiazem  gtt for afib with RVR into the 140s. She spontaneously converted out of afib and is now in sinus rhythm. Initial troponin 7. She now feels back to normal now that she is in sinus rhythm.   She has had a cough for the past 4 days. No fever. No heart failure symptoms. Has not had any chest pain while in sinus rhythm. Denies heavy alcohol use. Has been complaint with CPAP.   Past Medical History:  Diagnosis Date   Anxiety    Arthritis    Depression    Fibrocystic breast disease    Headache    MIGRAINES     Hypertension    OSA (obstructive sleep apnea)    CPAP   USES AT NIGHT   PONV (postoperative nausea and vomiting)    RLS (restless legs syndrome)     Past Surgical History:  Procedure Laterality Date   ANTERIOR CERVICAL DECOMP/DISCECTOMY FUSION N/A 06/08/2015   Procedure: ANTERIOR CERVICAL DISCECTOMY FUSION C4-C5, C5-C6     (2 LEVELS) ;  Surgeon: Donaciano Sprang, MD;  Location: [facility] OR;  Service: Orthopedics;  Laterality: N/A;   cyst removed     on hand x 2   scar tissue removed     right foot   SHOULDER SURGERY     right   TOTAL ABDOMINAL HYSTERECTOMY       Home Medications:  Prior to Admission medications   Medication Sig Start Date End Date Taking? Authorizing Provider  albuterol  (VENTOLIN  HFA) 108 (90 Base) MCG/ACT inhaler Inhale 2 puffs into the lungs as needed for wheezing or shortness of breath.    Yes [provider]  amLODipine  (NORVASC ) 2.5 MG tablet Take 2.5 mg by mouth every morning. 10/23/22  Yes [provider]  apixaban  (ELIQUIS ) 5 MG TABS tablet Take 1 tablet (5 mg total) by mouth 2 (two) times daily. 09/01/23  Yes Schilling Lynwood, MD  buPROPion  (WELLBUTRIN  XL) 150 MG 24 hr tablet Take 150 mg by mouth daily.   Yes [provider]  Cholecalciferol (HM VITAMIN D3) 4000 units CAPS Take 4,000 Units by mouth daily. Patient taking differently: Take 8,000 Units by mouth daily.   Yes [provider]  Cyanocobalamin (B-12 COMPLIANCE INJECTION) 1000 MCG/ML KIT Inject 1,000 mcg as directed every 30 (thirty) days.   Yes [provider]  hydrochlorothiazide  (HYDRODIURIL ) 25 MG tablet TAKE 1 TABLET BY MOUTH EVERY MORNING; Duration: 90 days   Yes [provider]  loratadine  (CLARITIN ) 10 MG tablet Take 10 mg by mouth daily.   Yes [provider]  losartan  (COZAAR ) 100 MG tablet TAKE 1 TABLET BY MOUTH EVERY DAY IN THE MORNING   Yes [provider]  metoprolol  succinate (TOPROL -XL) 100 MG 24 hr tablet Take 100 mg by mouth every morning.   Yes [provider]  MOUNJARO 7.5 MG/0.5ML Pen Inject 7.5 mg  into the skin once a week. 11/15/22  Yes [provider]  ondansetron  (ZOFRAN ) 4 MG tablet Take 4 mg by mouth daily as needed for nausea or vomiting. 05/27/23  Yes [provider]  sertraline  (ZOLOFT ) 100 MG tablet Take 200 mg by mouth daily.  Patient taking differently: Take 150 mg by mouth daily.   Yes [provider]  traMADol  (ULTRAM ) 50 MG tablet Take 50 mg by mouth every 8 (eight) hours as needed. Patient not taking: Reported on 10/09/2023 07/16/23   [provider]    Scheduled Meds:  apixaban   5 mg Oral BID   buPROPion   150 mg Oral Daily   hydrochlorothiazide   25 mg Oral Daily   loratadine   10 mg Oral Daily   losartan   100 mg Oral Daily   sertraline   300 mg Oral Daily   sodium  chloride flush  3 mL Intravenous Q12H   Continuous Infusions:  diltiazem  (CARDIZEM ) infusion 15 mg/hr (10/09/23 1706)   PRN Meds: acetaminophen  **OR** acetaminophen , albuterol , ondansetron  **OR** ondansetron  (ZOFRAN ) IV, traMADol   Allergies:    Allergies  Allergen Reactions   Sulfamethoxazole Shortness Of Breath   Gabapentin Other (See Comments)     respiratory distress   Lisinopril Other (See Comments)    dizziness, tinnitus   Methylphenidate Other (See Comments)     heart racing, irregular heart rate  Ritalin   Adhesive [Tape] Other (See Comments)    Blisters   Aripiprazole Other (See Comments)     did not work   Citalopram Other (See Comments)     did not work   Cyanocobalamin Diarrhea   Imodium [Loperamide] Hives   Levofloxacin Other (See Comments)    Unknown   Losartan  Potassium-Hctz Other (See Comments)    off balance   Prednisone Swelling    She is not able to tolerate the oral prednisone- causes soreness to the touch - within her skin    Semaglutide Other (See Comments)     Depression   Sulfamethoxazole-Trimethoprim Hives   Sulfonamide Derivatives Other (See Comments)    Dehydration   Sulfur Dioxide Other (See Comments)    Unknown    Varenicline Other (See Comments)     depression   Venlafaxine Other (See Comments)     did not work   Diphenhydramine Hcl Palpitations    Heart race    Social History:   Social History   Socioeconomic History   Marital status: Divorced    Spouse name: Not on file   Number of children: Not on file   Years of education: Not on file   Highest education level: Not on file  Occupational History   Occupation: 3rd grade teacher  Tobacco Use   Smoking status: Former    Current packs/day: 0.00    Average packs/day: 1 pack/day for 35.0 years (35.0 ttl pk-yrs)    Types: Cigarettes    Start date: 08/08/1977    Quit date: 08/08/2012    Years since quitting: 11.1   Smokeless tobacco: Not on file  Substance and Sexual Activity    Alcohol use: No   Drug use: No   Sexual activity: Not on file  Other Topics Concern   Not on file  Social History Narrative   Not on file   Social Drivers of Health   Financial Resource Strain: Not on file  Food Insecurity: No Food Insecurity (10/09/2023)   Hunger Vital Sign    Worried About Running Out of Food in the Last Year:  Never true    Ran Out of Food in the Last Year: Never true  Transportation Needs: No Transportation Needs (10/09/2023)   PRAPARE - Administrator, Civil Service (Medical): No    Lack of Transportation (Non-Medical): No  Physical Activity: Not on file  Stress: Not on file  Social Connections: Moderately Isolated (10/09/2023)   Social Connection and Isolation Panel    Frequency of Communication with Friends and Family: More than three times a week    Frequency of Social Gatherings with Friends and Family: Twice a week    Attends Religious Services: More than 4 times per year    Active Member of [employer] or Organizations: No    Attends Banker Meetings: Never    Marital Status: Divorced  Catering manager Violence: Not At Risk (10/09/2023)   Humiliation, Afraid, Rape, and Kick questionnaire    Fear of Current or Ex-Partner: No    Emotionally Abused: No    Physically Abused: No    Sexually Abused: No    Family History:    Family History  Problem Relation Age of Onset   Heart disease Mother    Emphysema Father    Cancer Father        unsure what kind   Allergies Sister    Heart disease Sister    Breast cancer Maternal Aunt    Breast cancer Maternal Aunt    Breast cancer Maternal Aunt      ROS:  Please see the history of present illness.  All other ROS reviewed and negative.     Physical Exam/Data: Vitals:   10/09/23 1333 10/09/23 1700 10/09/23 1920 10/09/23 2132  BP: 119/61 117/72 122/87   Pulse:   75 81  Resp:  20 18 18   Temp: 98.2 F (36.8 C) 97.8 F (36.6 C) 98.3 F (36.8 C)   TempSrc: Oral Oral Oral   SpO2:  96%  96%   Weight:      Height:        Intake/Output Summary (Last 24 hours) at 10/09/2023 2208 Last data filed at 10/09/2023 1706 Gross per 24 hour  Intake 120.76 ml  Output --  Net 120.76 ml      10/09/2023    5:47 AM 08/13/2023    9:13 AM 06/12/2023    1:48 PM  Last 3 Weights  Weight (lbs) 218 lb 223 lb 231 lb  Weight (kg) 98.884 kg 101.152 kg 104.781 kg     Body mass index is 37.42 kg/m.  General:  Well nourished, well developed, in no acute distress HEENT: normal Neck: no JVD Vascular:  Distal pulses 2+ bilaterally Cardiac:  RRR Lungs:  Breathing room air comfortably, no resp distress Abd: soft, nontender, no hepatomegaly  Ext: no edema Musculoskeletal:  No deformities, BUE and BLE strength normal and equal Skin: warm and dry  Neuro:  no focal abnormalities noted Psych:  Normal affect   EKG:  The EKG was personally reviewed and demonstrates:  initial afib with RVR, now sinus rhythm, normal QT while in sinus Telemetry:  Telemetry was personally reviewed and demonstrates:  SR, previously afib with rvr  Relevant CV Studies: TTE  09/25/2023 IMPRESSIONS   1. Left ventricular ejection fraction, by estimation, is 60 to 65%. The  left ventricle has normal function. The left ventricle has no regional  wall motion abnormalities. Left ventricular diastolic parameters are  consistent with Grade I diastolic  dysfunction (impaired relaxation).   2. Right ventricular systolic function is  normal. The right ventricular  size is normal.   3. The mitral valve is degenerative. Trivial mitral valve regurgitation.  No evidence of mitral stenosis.   4. The aortic valve is tricuspid. Aortic valve regurgitation is not  visualized. Aortic valve sclerosis is present, with no evidence of aortic  valve stenosis.   5. The inferior vena cava is normal in size with greater than 50%  respiratory variability, suggesting right atrial pressure of 3 mmHg.   CTA Heart  10/2011 IMPRESSION: 1. Mild to  moderate coronary artery disease. The patient's total coronary artery calcium score is 11.92, which is 89 percentile for patient's matched age and gender. 2. Noncalcified plaque in the proximal LAD causing approximately 50% diameter stenosis (though this may be artifactually increased as there is patient motion). 3. Noncalcified plaque in the proximal left circumflex artery causing less than 25% diameter stenosis. 4. Right coronary artery dominance. 5. Borderline left ventricular hypertrophy. 6. 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   Laboratory Data: High Sensitivity Troponin:   Recent Labs  Lab 10/09/23 0601  TROPONINIHS 7     Chemistry Recent Labs  Lab 10/09/23 0601  NA 145  K 3.7  CL 110  CO2 19*  GLUCOSE 133*  BUN 9  CREATININE 0.81  CALCIUM 9.2  MG 2.0  GFRNONAA >60  ANIONGAP 16*    Hematology Recent Labs  Lab 10/09/23 0601  WBC 9.6  RBC 4.33  HGB 13.0  HCT 38.9  MCV 89.8  MCH 30.0  MCHC 33.4  RDW 13.8  PLT 246   TSH 1.387 from 08/01/2023  Radiology/Studies:  DG CHEST PORT 1 VIEW Result Date: 10/09/2023 CLINICAL DATA:  Atrial fibrillation. EXAM: PORTABLE CHEST 1 VIEW COMPARISON:  August 01, 2023. FINDINGS: The heart size and mediastinal contours are within normal limits. Both lungs are clear. The visualized skeletal structures are unremarkable. IMPRESSION: No active disease. Electronically Signed   By: Lynwood Landy Raddle M.D.   On: 10/09/2023 10:20   Assessment and Plan: Paroxysmal atrial fibrillation  Stroke prevention The patient had an episode of symptomatic afib with RVR which prompted a visit to the ED. She has been seen in afib clinic and has been taking metoprolol  succinate 100mg  along with Eliquis . She self converted out of afib and is now in sinus rhythm. She is very symptomatic which is likely due to the rapid ventricular response. Therefore, I think it is reasonable to have her take short acting PO diltiazem  prn once she feels herself in afib with  RVR. She is a good candidate for catheter ablation and is interested. However, this would be an outpatient discussion. Her risk factors for afib include OSA and obesity. Her current afib with rvr episode may have been triggered by recent URI (has had a cough for the past 4 days).  - Continue metoprolol  100mg  daily - Stop diltiazem  gtt - Can use PRN PO short acting diltiazem  60mg  when in RVR - Can discuss catheter ablation vs rhythm control agents further on the outpatient setting - Continue Eliquis   Htn  DM CAD Does have CAD from CTA heart in 2013 so would not be a candidate for 1c agents. Otherwise normal TTE from <1 month ago.   Risk Assessment/Risk Scores:   CHA2DS2-VASc Score = 4   This indicates a 4.8% annual risk of stroke. The patient's score is based upon: CHF History: 0 HTN History: 1 Diabetes History: 1 Stroke History: 0 Vascular Disease History: 1 Age Score: 0 Gender Score: 1  For  questions or updates, please contact  [facility] Please consult [email or web address] for contact info under    Signed, Jerrell DELENA Orchard, MD  10/09/2023 10:08 PM

## 2023-10-09 NOTE — ED Notes (Signed)
 Pt ambulated to restroom without difficulty

## 2023-10-09 NOTE — Plan of Care (Signed)

## 2023-10-09 NOTE — ED Notes (Signed)
 Pt ambulated to restroom.

## 2023-10-09 NOTE — ED Triage Notes (Signed)
 Pt BIB EMS from home. EMS reports pt in afib RVR, recently diagnosed afib in June. Initially 96-186 with SHOB.   EMS admin 45mg  Cardizem  total and 500ml NS pta  EMS VS Initially HR 96-186, following Cardizem  administration HR 60-150, 126/82, 96% RA

## 2023-10-10 ENCOUNTER — Other Ambulatory Visit: Payer: Self-pay

## 2023-10-10 DIAGNOSIS — I4891 Unspecified atrial fibrillation: Secondary | ICD-10-CM

## 2023-10-10 DIAGNOSIS — G4733 Obstructive sleep apnea (adult) (pediatric): Secondary | ICD-10-CM | POA: Diagnosis not present

## 2023-10-10 DIAGNOSIS — I480 Paroxysmal atrial fibrillation: Secondary | ICD-10-CM | POA: Diagnosis not present

## 2023-10-10 DIAGNOSIS — I10 Essential (primary) hypertension: Secondary | ICD-10-CM | POA: Diagnosis not present

## 2023-10-10 LAB — BASIC METABOLIC PANEL WITH GFR
Anion gap: 9 (ref 5–15)
BUN: 10 mg/dL (ref 8–23)
CO2: 25 mmol/L (ref 22–32)
Calcium: 9.5 mg/dL (ref 8.9–10.3)
Chloride: 106 mmol/L (ref 98–111)
Creatinine, Ser: 0.91 mg/dL (ref 0.44–1.00)
GFR, Estimated: >60 mL/min (ref >60)
Glucose, Bld: 123 mg/dL — ABNORMAL HIGH (ref 70–99)
Potassium: 3.2 mmol/L — ABNORMAL LOW (ref 3.5–5.1)
Sodium: 140 mmol/L (ref 135–145)

## 2023-10-10 LAB — CBC
HCT: 34.2 % — ABNORMAL LOW (ref 36.0–46.0)
Hemoglobin: 11.6 g/dL — ABNORMAL LOW (ref 12.0–15.0)
MCH: 30.1 pg (ref 26.0–34.0)
MCHC: 33.9 g/dL (ref 30.0–36.0)
MCV: 88.8 fL (ref 80.0–100.0)
Platelets: 213 K/uL (ref 150–400)
RBC: 3.85 MIL/uL — ABNORMAL LOW (ref 3.87–5.11)
RDW: 14.1 % (ref 11.5–15.5)
WBC: 7.5 K/uL (ref 4.0–10.5)
nRBC: 0 % (ref 0.0–0.2)

## 2023-10-10 LAB — MAGNESIUM: Magnesium: 2.1 mg/dL (ref 1.7–2.4)

## 2023-10-10 MED ORDER — SPIRONOLACTONE 25 MG PO TABS: 25.0000 mg | ORAL_TABLET | Freq: Every day | ORAL | 2 refills | Status: AC

## 2023-10-10 MED ORDER — METOPROLOL SUCCINATE ER 100 MG PO TB24
100.0000 mg | ORAL_TABLET | Freq: Every day | ORAL | Status: DC
  Administered 2023-10-10: 100 mg via ORAL
  Filled 2023-10-10: qty 1

## 2023-10-10 MED ORDER — SPIRONOLACTONE 25 MG PO TABS
25.0000 mg | ORAL_TABLET | Freq: Every day | ORAL | Status: DC
  Administered 2023-10-10: 25 mg via ORAL
  Filled 2023-10-10: qty 1

## 2023-10-10 MED ORDER — POTASSIUM CHLORIDE CRYS ER 20 MEQ PO TBCR
40.0000 meq | EXTENDED_RELEASE_TABLET | Freq: Once | ORAL | Status: AC
  Administered 2023-10-10: 40 meq via ORAL

## 2023-10-10 MED ORDER — DILTIAZEM HCL 30 MG PO TABS: 30.0000 mg | ORAL_TABLET | Freq: Every day | ORAL | 1 refills | Status: DC | PRN

## 2023-10-10 MED ORDER — POTASSIUM CHLORIDE CRYS ER 20 MEQ PO TBCR
40.0000 meq | EXTENDED_RELEASE_TABLET | Freq: Two times a day (BID) | ORAL | Status: DC
  Filled 2023-10-10: qty 2

## 2023-10-10 NOTE — Progress Notes (Addendum)
  Progress Note  Patient Name: ANNALEIGH STEINMEYER Date of Encounter: 10/10/2023 [facility] Cardiologist: Lynwood Schilling, MD   Interval Summary   Remains in sinus rhythm Feels good, slightly fatigued from being in RVR Eager for discharge Will arrange close outpatient follow up with A. Fib clinic   Vital Signs Vitals:   10/09/23 2132 10/09/23 2324 10/10/23 0029 10/10/23 0422  BP:  102/62 114/66 109/61  Pulse: 81 68 83 63  Resp: 18 16  18   Temp:  99.9 F (37.7 C)  98.8 F (37.1 C)  TempSrc:  Oral  Oral  SpO2:  98%  90%  Weight:      Height:        Intake/Output Summary (Last 24 hours) at 10/10/2023 0825 Last data filed at 10/09/2023 2325 Gross per 24 hour  Intake 360.76 ml  Output --  Net 360.76 ml      10/09/2023    5:47 AM 08/13/2023    9:13 AM 06/12/2023    1:48 PM  Last 3 Weights  Weight (lbs) 218 lb 223 lb 231 lb  Weight (kg) 98.884 kg 101.152 kg 104.781 kg     Telemetry/ECG  Sinus rhythm, HR 60s - Personally Reviewed  Physical Exam  GEN: No acute distress.   Neck: No JVD Cardiac: RRR, no murmurs, rubs, or gallops.  Respiratory: Clear to auscultation bilaterally. GI: Soft, nontender, non-distended  MS: No edema  Assessment & Plan   Paroxysmal atrial fibrillation with RVR Presented to ED with symptomatic A. Fib with RVR Recent URI may have been trigger for RVR episode  Recently seen in A. Fib clinic 08/2023  Recent long term monitor showed 2% A. Fib burden  Converted back to sinus  Continue Toprol  100 mg daily Would add PRN PO diltiazem  for rapid HRs at discharge  Continue Eliquis  5 mg BID Will arrange outpatient follow up with A. Fib clinic   Hypertension BP has been well controlled Continue hydrochlorothiazide  25 mg daily Continue Losartan  100 mg daily Continue Toprol  100 mg daily  Hypokalemia K 3.2 this morning May be secondary to hydrochlorothiazide  May need to consider alternative agent if hypokalemia persists  Ordered supplementation    Mild to moderate CAD noted on CCTA, 2013 No active chest pain Troponin negative   Per primary Anxiety  Depression OSA on CPAP    For questions or updates, please contact [facility] Please consult [email or web address] for contact info under       Signed, Waddell DELENA Donath, PA-C

## 2023-10-10 NOTE — TOC CM/SW Note (Signed)
 Transition of Care [facility]) - Inpatient Brief Assessment   Patient Details  Name: Kristina Glover MRN: [id number] Date of Birth: [birth year]  [facility] CM/SW Contact:    Sudie Erminio Deems, RN Phone Number: 10/10/2023, 11:42 AM   Clinical Narrative: Patient presented for atrial fibrillation. PTA patient was independent from home alone. Patient has PCP and has transportation to appointments. Patient states she can afford medications. No further needs identified at this time.      Transition of Care Asessment: Insurance and Status: Insurance coverage has been reviewed Patient has primary care physician: Yes Home environment has been reviewed: reviewed Prior level of function:: independent Prior/Current Home Services: No current home services Social Drivers of Health Review: SDOH reviewed no interventions necessary Readmission risk has been reviewed: Yes Transition of care needs: no transition of care needs at this time

## 2023-10-10 NOTE — Plan of Care (Signed)

## 2023-10-10 NOTE — Plan of Care (Signed)

## 2023-10-10 NOTE — Discharge Summary (Signed)
 Physician Discharge Summary   Patient: Kristina Glover MRN: [id number] DOB: 10/17/1960  Admit date:     10/09/2023  Discharge date: 10/10/23  Discharge Physician: Elgie Butter   PCP: Pcp, No   Recommendations at discharge:  Please follow up with PCP in one week.  Please follow up with cardiology as scheduled.   Discharge Diagnoses: Principal Problem:   Paroxysmal atrial fibrillation (HCC) Active Problems:   HYPERTENSION   CAD (coronary artery disease)   Anxiety and depression   OSA on CPAP   Obesity, Class II, BMI 35-39.9  Resolved Problems:   * No resolved hospital problems. *  Hospital Course: Kristina Glover is a 63 y.o. female with medical history significant of hypertension, paroxysmal A-fib, anxiety, depression, and OSA presents with chest pressure and palpitations.    Assessment and Plan:  Paroxysmal atrial fibrillation Patient presented with complaints of chest pressure and palpitations.  Patient found to be in A-fib heart rates into the 130s on admission.. CHA2DS2-VASc score equal to at least 4.  She admitted to intermittently missing a couple doses of Eliquis  recently. She was started on cardizem  gtt and transitioned to oral cardizem  prn on discharge. .  She has been in NSR .  Continue with metoprolol  and eliquis  on discharge.     Essential hypertension Well controlled.    CAD No chest pain.    Anxiety and depression - Continue Wellbutrin  and Zoloft    OSA on CPAP - Continue CPAP nightly   Obesity, class II BMI 37.42 kg/m.  Patient is on Mounjaro in the outpatient setting.     Hypokalemia Replaced.   Consultants: cardiology.  Procedures performed: none.   Disposition: Home Diet recommendation:  Discharge Diet Orders (From admission, onward)     Start     Ordered   10/10/23 0000  Diet - low sodium heart healthy        10/10/23 1316           Cardiac diet DISCHARGE MEDICATION: Allergies as of 10/10/2023       Reactions   Sulfamethoxazole  Shortness Of Breath   Gabapentin Other (See Comments)    respiratory distress   Lisinopril Other (See Comments)   dizziness, tinnitus   Methylphenidate Other (See Comments)    heart racing, irregular heart rate Ritalin   Adhesive [tape] Other (See Comments)   Blisters   Aripiprazole Other (See Comments)    did not work   Citalopram Other (See Comments)    did not work   Cyanocobalamin Diarrhea   Imodium [loperamide] Hives   Levofloxacin Other (See Comments)   Unknown   Losartan  Potassium-hctz Other (See Comments)   off balance   Prednisone Swelling   She is not able to tolerate the oral prednisone- causes soreness to the touch - within her skin   Semaglutide Other (See Comments)    Depression   Sulfamethoxazole-trimethoprim Hives   Sulfonamide Derivatives Other (See Comments)   Dehydration   Sulfur Dioxide Other (See Comments)   Unknown    Varenicline Other (See Comments)    depression   Venlafaxine Other (See Comments)    did not work   Diphenhydramine Hcl Palpitations   Heart race        Medication List     STOP taking these medications    amLODipine  2.5 MG tablet Commonly known as: NORVASC    hydrochlorothiazide  25 MG tablet Commonly known as: HYDRODIURIL    traMADol  50 MG tablet Commonly known as: ULTRAM   TAKE these medications    albuterol  108 (90 Base) MCG/ACT inhaler Commonly known as: VENTOLIN  HFA Inhale 2 puffs into the lungs as needed for wheezing or shortness of breath.   apixaban  5 MG Tabs tablet Commonly known as: ELIQUIS  Take 1 tablet (5 mg total) by mouth 2 (two) times daily.   B-12 Compliance Injection 1000 MCG/ML Kit Generic drug: Cyanocobalamin Inject 1,000 mcg as directed every 30 (thirty) days.   buPROPion  150 MG 24 hr tablet Commonly known as: WELLBUTRIN  XL Take 150 mg by mouth daily.   Claritin  10 MG tablet Generic drug: loratadine  Take 10 mg by mouth daily.   diltiazem  30 MG tablet Commonly known as:  Cardizem  Take 1 tablet (30 mg total) by mouth daily as needed (for palpitations and HR>100/MIN.).   HM Vitamin D3 100 MCG (4000 UT) Caps Generic drug: Cholecalciferol Take 4,000 Units by mouth daily. What changed: how much to take   losartan  100 MG tablet Commonly known as: COZAAR  TAKE 1 TABLET BY MOUTH EVERY DAY IN THE MORNING   metoprolol  succinate 100 MG 24 hr tablet Commonly known as: TOPROL -XL Take 100 mg by mouth every morning.   Mounjaro 7.5 MG/0.5ML Pen Generic drug: tirzepatide Inject 7.5 mg into the skin once a week.   ondansetron  4 MG tablet Commonly known as: ZOFRAN  Take 4 mg by mouth daily as needed for nausea or vomiting.   sertraline  100 MG tablet Commonly known as: ZOLOFT  Take 200 mg by mouth daily. What changed: how much to take   spironolactone  25 MG tablet Commonly known as: ALDACTONE  Take 1 tablet (25 mg total) by mouth daily. Start taking on: October 11, 2023        Discharge Exam: Fredricka Weights   10/09/23 0547  Weight: 98.9 kg   General exam: Appears calm and comfortable  Respiratory system: Clear to auscultation. Respiratory effort normal. Cardiovascular system: S1 & S2 heard, RRR. No JVD, murmurs, rubs, gallops or clicks. No pedal edema. Gastrointestinal system: Abdomen is nondistended, soft and nontender.  Central nervous system: Alert and oriented. No focal neurological deficits. Extremities: Symmetric 5 x 5 power. Skin: No rashes, lesions or ulcers Psychiatry: Judgement and insight appear normal. Mood & affect appropriate.    Condition at discharge: fair  The results of significant diagnostics from this hospitalization (including imaging, microbiology, ancillary and laboratory) are listed below for reference.   Imaging Studies: DG CHEST PORT 1 VIEW Result Date: 10/09/2023 CLINICAL DATA:  Atrial fibrillation. EXAM: PORTABLE CHEST 1 VIEW COMPARISON:  August 01, 2023. FINDINGS: The heart size and mediastinal contours are within normal  limits. Both lungs are clear. The visualized skeletal structures are unremarkable. IMPRESSION: No active disease. Electronically Signed   By: Lynwood Landy Raddle M.D.   On: 10/09/2023 10:20   MM 3D DIAGNOSTIC MAMMOGRAM BILATERAL BREAST Result Date: 09/26/2023 CLINICAL DATA:  Screening recall for a possible right breast mass and a possible left breast asymmetry. EXAM: DIGITAL DIAGNOSTIC BILATERAL MAMMOGRAM WITH TOMOSYNTHESIS AND CAD; ULTRASOUND RIGHT BREAST LIMITED; ULTRASOUND LEFT BREAST LIMITED TECHNIQUE: Bilateral digital diagnostic mammography and breast tomosynthesis was performed. The images were evaluated with computer-aided detection. ; Targeted ultrasound examination of the right breast was performed; Targeted ultrasound examination of the left breast was performed. COMPARISON:  Previous exam(s). ACR Breast Density Category c: The breasts are heterogeneously dense, which may obscure small masses. FINDINGS: On the right, on spot compression imaging, the possible mass noted in the upper outer right breast persists as an equal to low-attenuation oval  mostly circumscribed, 1.2 cm mass at middle to posterior depth. On the left, the possible asymmetries noted in the anterior to lateral upper outer left breast partly disperses on spot compression imaging. There is no defined underlying mass, no distortion and no suspicious calcifications. On physical exam, no mass is palpated in the upper outer left breast. Targeted right breast ultrasound is performed, showing a simple cyst in the posterior aspect of the right breast at 10 o'clock, 7 cm the nipple, measuring 1.4 x 0.9 x 1.0 cm. This is consistent in size, shape and location to the mammographic mass. There are additional smaller cysts in the upper outer right breast. No solid masses or suspicious findings. Targeted left breast ultrasound is performed, showing multiple cysts and clustered cysts in an overall pattern consistent with fibrocystic change. This is seen  throughout the upper outer left breast. No solid or suspicious mass or lesion. IMPRESSION: 1. No evidence of breast malignancy. 2. Benign bilateral breast cysts a clusters of cysts. RECOMMENDATION: Screening mammogram in one year.(Code:SM-B-01Y) I have discussed the findings and recommendations with the patient. If applicable, a reminder letter will be sent to the patient regarding the next appointment. BI-RADS CATEGORY  2: Benign. Electronically Signed   By: Alm Parkins M.D.   On: 09/26/2023 10:34   US  LIMITED ULTRASOUND INCLUDING AXILLA LEFT BREAST  Result Date: 09/26/2023 CLINICAL DATA:  Screening recall for a possible right breast mass and a possible left breast asymmetry. EXAM: DIGITAL DIAGNOSTIC BILATERAL MAMMOGRAM WITH TOMOSYNTHESIS AND CAD; ULTRASOUND RIGHT BREAST LIMITED; ULTRASOUND LEFT BREAST LIMITED TECHNIQUE: Bilateral digital diagnostic mammography and breast tomosynthesis was performed. The images were evaluated with computer-aided detection. ; Targeted ultrasound examination of the right breast was performed; Targeted ultrasound examination of the left breast was performed. COMPARISON:  Previous exam(s). ACR Breast Density Category c: The breasts are heterogeneously dense, which may obscure small masses. FINDINGS: On the right, on spot compression imaging, the possible mass noted in the upper outer right breast persists as an equal to low-attenuation oval mostly circumscribed, 1.2 cm mass at middle to posterior depth. On the left, the possible asymmetries noted in the anterior to lateral upper outer left breast partly disperses on spot compression imaging. There is no defined underlying mass, no distortion and no suspicious calcifications. On physical exam, no mass is palpated in the upper outer left breast. Targeted right breast ultrasound is performed, showing a simple cyst in the posterior aspect of the right breast at 10 o'clock, 7 cm the nipple, measuring 1.4 x 0.9 x 1.0 cm. This is  consistent in size, shape and location to the mammographic mass. There are additional smaller cysts in the upper outer right breast. No solid masses or suspicious findings. Targeted left breast ultrasound is performed, showing multiple cysts and clustered cysts in an overall pattern consistent with fibrocystic change. This is seen throughout the upper outer left breast. No solid or suspicious mass or lesion. IMPRESSION: 1. No evidence of breast malignancy. 2. Benign bilateral breast cysts a clusters of cysts. RECOMMENDATION: Screening mammogram in one year.(Code:SM-B-01Y) I have discussed the findings and recommendations with the patient. If applicable, a reminder letter will be sent to the patient regarding the next appointment. BI-RADS CATEGORY  2: Benign. Electronically Signed   By: Alm Parkins M.D.   On: 09/26/2023 10:34   US  LIMITED ULTRASOUND INCLUDING AXILLA RIGHT BREAST Result Date: 09/26/2023 CLINICAL DATA:  Screening recall for a possible right breast mass and a possible left breast asymmetry. EXAM: DIGITAL  DIAGNOSTIC BILATERAL MAMMOGRAM WITH TOMOSYNTHESIS AND CAD; ULTRASOUND RIGHT BREAST LIMITED; ULTRASOUND LEFT BREAST LIMITED TECHNIQUE: Bilateral digital diagnostic mammography and breast tomosynthesis was performed. The images were evaluated with computer-aided detection. ; Targeted ultrasound examination of the right breast was performed; Targeted ultrasound examination of the left breast was performed. COMPARISON:  Previous exam(s). ACR Breast Density Category c: The breasts are heterogeneously dense, which may obscure small masses. FINDINGS: On the right, on spot compression imaging, the possible mass noted in the upper outer right breast persists as an equal to low-attenuation oval mostly circumscribed, 1.2 cm mass at middle to posterior depth. On the left, the possible asymmetries noted in the anterior to lateral upper outer left breast partly disperses on spot compression imaging. There is no  defined underlying mass, no distortion and no suspicious calcifications. On physical exam, no mass is palpated in the upper outer left breast. Targeted right breast ultrasound is performed, showing a simple cyst in the posterior aspect of the right breast at 10 o'clock, 7 cm the nipple, measuring 1.4 x 0.9 x 1.0 cm. This is consistent in size, shape and location to the mammographic mass. There are additional smaller cysts in the upper outer right breast. No solid masses or suspicious findings. Targeted left breast ultrasound is performed, showing multiple cysts and clustered cysts in an overall pattern consistent with fibrocystic change. This is seen throughout the upper outer left breast. No solid or suspicious mass or lesion. IMPRESSION: 1. No evidence of breast malignancy. 2. Benign bilateral breast cysts a clusters of cysts. RECOMMENDATION: Screening mammogram in one year.(Code:SM-B-01Y) I have discussed the findings and recommendations with the patient. If applicable, a reminder letter will be sent to the patient regarding the next appointment. BI-RADS CATEGORY  2: Benign. Electronically Signed   By: Alm Parkins M.D.   On: 09/26/2023 10:34   ECHOCARDIOGRAM COMPLETE Result Date: 09/25/2023    ECHOCARDIOGRAM REPORT   Patient Name:   [facility] A Banales  Date of Exam: 09/25/2023 Medical Rec #:  [id number]     Height:       64.0 in Accession #:    [id number]    Weight:       223.0 lb Date of Birth:  1960-06-28    BSA:          2.049 m Patient Age:    62 years      BP:           156/84 mmHg Patient Gender: F             HR:           73 bpm. Exam Location:  [address] Procedure: 2D Echo (Both Spectral and Color Flow Doppler were utilized during            procedure). Indications:    I48.92* Unspecified atrial flutter  History:        Patient has no prior history of Echocardiogram examinations.                 Risk Factors:Hypertension, Sleep Apnea and Former Smoker.  Sonographer:    Jon Hacker RCS Referring  Phys: [id number] ZANE ADAMS IMPRESSIONS  1. Left ventricular ejection fraction, by estimation, is 60 to 65%. The left ventricle has normal function. The left ventricle has no regional wall motion abnormalities. Left ventricular diastolic parameters are consistent with Grade I diastolic dysfunction (impaired relaxation).  2. Right ventricular systolic function is normal. The right ventricular size is normal.  3. The  mitral valve is degenerative. Trivial mitral valve regurgitation. No evidence of mitral stenosis.  4. The aortic valve is tricuspid. Aortic valve regurgitation is not visualized. Aortic valve sclerosis is present, with no evidence of aortic valve stenosis.  5. The inferior vena cava is normal in size with greater than 50% respiratory variability, suggesting right atrial pressure of 3 mmHg. FINDINGS  Left Ventricle: Left ventricular ejection fraction, by estimation, is 60 to 65%. The left ventricle has normal function. The left ventricle has no regional wall motion abnormalities. The left ventricular internal cavity size was normal in size. There is  no left ventricular hypertrophy. Left ventricular diastolic parameters are consistent with Grade I diastolic dysfunction (impaired relaxation). Right Ventricle: The right ventricular size is normal. No increase in right ventricular wall thickness. Right ventricular systolic function is normal. Left Atrium: Left atrial size was normal in size. Right Atrium: Right atrial size was normal in size. Pericardium: There is no evidence of pericardial effusion. Mitral Valve: The mitral valve is degenerative in appearance. Mild mitral annular calcification. Trivial mitral valve regurgitation. No evidence of mitral valve stenosis. Tricuspid Valve: The tricuspid valve is normal in structure. Tricuspid valve regurgitation is not demonstrated. No evidence of tricuspid stenosis. Aortic Valve: The aortic valve is tricuspid. Aortic valve regurgitation is not visualized. Aortic  valve sclerosis is present, with no evidence of aortic valve stenosis. Pulmonic Valve: The pulmonic valve was normal in structure. Pulmonic valve regurgitation is not visualized. No evidence of pulmonic stenosis. Aorta: The aortic root is normal in size and structure. Venous: The inferior vena cava is normal in size with greater than 50% respiratory variability, suggesting right atrial pressure of 3 mmHg. IAS/Shunts: No atrial level shunt detected by color flow Doppler.  LEFT VENTRICLE PLAX 2D LVIDd:         4.79 cm   Diastology LVIDs:         2.64 cm   LV e' medial:    8.70 cm/s LV PW:         1.02 cm   LV E/e' medial:  14.8 LV IVS:        0.86 cm   LV e' lateral:   7.22 cm/s LVOT diam:     2.00 cm   LV E/e' lateral: 17.9 LV SV:         99 LV SV Index:   48 LVOT Area:     3.14 cm  RIGHT VENTRICLE RV Basal diam:  2.67 cm RV S prime:     11.90 cm/s TAPSE (M-mode): 1.8 cm LEFT ATRIUM             Index        RIGHT ATRIUM           Index LA diam:        4.50 cm 2.20 cm/m   RA Area:     13.80 cm LA Vol (A2C):   55.7 ml 27.18 ml/m  RA Volume:   30.70 ml  14.98 ml/m LA Vol (A4C):   54.2 ml 26.45 ml/m LA Biplane Vol: 58.2 ml 28.40 ml/m  AORTIC VALVE LVOT Vmax:   132.00 cm/s LVOT Vmean:  91.700 cm/s LVOT VTI:    0.315 m  AORTA Ao Root diam: 2.40 cm Ao Asc diam:  3.40 cm MV E velocity: 129.00 cm/s MV A velocity: 126.00 cm/s  SHUNTS MV E/A ratio:  1.02         Systemic VTI:  0.32 m  Systemic Diam: 2.00 cm Morene Brownie Electronically signed by Morene Brownie Signature Date/Time: 09/25/2023/4:22:38 PM    Final     Microbiology: Results for orders placed or performed during the hospital encounter of 06/02/15  Surgical pcr screen     Status: None   Collection Time: 06/02/15 10:43 AM   Specimen: Nasal Mucosa; Nasal Swab  Result Value Ref Range Status   MRSA, PCR NEGATIVE NEGATIVE Final   Staphylococcus aureus NEGATIVE NEGATIVE Final    Comment:        The Xpert SA Assay  (FDA approved for NASAL specimens in patients over 76 years of age), is one component of a comprehensive surveillance program.  Test performance has been validated by [facility] for patients greater than or equal to 26 year old. It is not intended to diagnose infection nor to guide or monitor treatment.     Labs: CBC: Recent Labs  Lab 10/09/23 0601 10/10/23 0404  WBC 9.6 7.5  HGB 13.0 11.6*  HCT 38.9 34.2*  MCV 89.8 88.8  PLT 246 213   Basic Metabolic Panel: Recent Labs  Lab 10/09/23 0601 10/10/23 0404  NA 145 140  K 3.7 3.2*  CL 110 106  CO2 19* 25  GLUCOSE 133* 123*  BUN 9 10  CREATININE 0.81 0.91  CALCIUM 9.2 9.5  MG 2.0 2.1   Liver Function Tests: No results for input(s): AST, ALT, ALKPHOS, BILITOT, PROT, ALBUMIN in the last 168 hours. CBG: No results for input(s): GLUCAP in the last 168 hours.  Discharge time spent: 42 minutes.   Signed: Liylah Najarro, MD [facility] 10/10/2023

## 2023-10-13 LAB — GENECONNECT MOLECULAR SCREEN: Genetic Analysis Overall Interpretation: NEGATIVE

## 2023-10-14 LAB — ALDOSTERONE + RENIN ACTIVITY W/ RATIO
ALDO / PRA Ratio: 9.9 (ref 0.0–30.0)
Aldosterone: 5.2 ng/dL (ref 0.0–30.0)
PRA LC/MS/MS: 0.524 ng/mL/h (ref 0.167–5.380)

## 2023-10-16 ENCOUNTER — Ambulatory Visit: Payer: Self-pay | Admitting: Cardiovascular Disease

## 2023-10-22 ENCOUNTER — Ambulatory Visit (HOSPITAL_COMMUNITY)
Admit: 2023-10-22 | Discharge: 2023-10-22 | Disposition: A | Discharge status: Home / Self Care | Attending: Internal Medicine | Admitting: Internal Medicine

## 2023-10-22 ENCOUNTER — Encounter (HOSPITAL_COMMUNITY): Payer: Self-pay | Admitting: Internal Medicine

## 2023-10-22 VITALS — BP 124/68 | HR 71 | Ht 64.0 in | Wt 222.0 lb

## 2023-10-22 DIAGNOSIS — D6869 Other thrombophilia: Secondary | ICD-10-CM | POA: Diagnosis not present

## 2023-10-22 DIAGNOSIS — I4819 Other persistent atrial fibrillation: Secondary | ICD-10-CM | POA: Diagnosis not present

## 2023-10-22 DIAGNOSIS — I480 Paroxysmal atrial fibrillation: Secondary | ICD-10-CM | POA: Diagnosis not present

## 2023-10-22 NOTE — Progress Notes (Signed)
 Primary Care Physician: Alben Therisa MATSU, PA Primary Cardiologist: Lynwood Schilling, MD Electrophysiologist: None     Referring Physician: Dr. Schilling Kristina Glover is a 63 y.o. female with a history of OSA, tobacco use, morbid obesity, T2DM, COPD, HLD, CAD, HTN, and atrial fibrillation who presents for consultation in the [facility]. ED visit on 08/01/23 for new onset Afib likely precipitated by not wearing CPAP due to temporary dental bridge and waiting on permanent bridge. Consulted in ED by Dr. Schilling - started on Eliquis . Converted to NSR while in ED. Patient is on Eliquis  5 mg BID for a CHADS2VASC score of 4.  On evaluation today, she is currently in NSR. She has noted intermittent palpitations. She resumed her CPAP treatment the night of the ED visit. No missed doses of Eliquis .  On follow up 10/22/23, patient is currently in NSR. Recent hospital admission 9/4-5 for Afib with RVR. Patient did have 3 episodes of Afib a couple of months ago when visiting [place]. Continues on Toprol  100 mg daily and given diltiazem  30 mg PRN. No missed doses of Eliquis .   Today, she denies symptoms of chest pain, shortness of breath, orthopnea, PND, lower extremity edema, dizziness, presyncope, syncope, snoring, daytime somnolence, bleeding, or neurologic sequela. The patient is tolerating medications without difficulties and is otherwise without complaint today.    Atrial Fibrillation Risk Factors:  she does have symptoms or diagnosis of sleep apnea. she is compliant with CPAP therapy.  she has a BMI of Body mass index is 38.11 kg/m.SABRA Filed Weights   10/22/23 1430  Weight: 100.7 kg     Current Outpatient Medications  Medication Sig Dispense Refill   albuterol  (VENTOLIN  HFA) 108 (90 Base) MCG/ACT inhaler Inhale 2 puffs into the lungs as needed for wheezing or shortness of breath.     apixaban  (ELIQUIS ) 5 MG TABS tablet Take 1 tablet (5 mg total) by mouth 2  (two) times daily. 60 tablet 6   buPROPion  (WELLBUTRIN  XL) 150 MG 24 hr tablet Take 150 mg by mouth daily.     Cholecalciferol (HM VITAMIN D3) 4000 units CAPS Take 4,000 Units by mouth daily. (Patient taking differently: Take 8,000 Units by mouth daily.)     Cyanocobalamin (B-12 COMPLIANCE INJECTION) 1000 MCG/ML KIT Inject 1,000 mcg as directed every 30 (thirty) days.     diltiazem  (CARDIZEM ) 30 MG tablet Take 1 tablet (30 mg total) by mouth daily as needed (for palpitations and HR>100/MIN.). 30 tablet 1   loratadine  (CLARITIN ) 10 MG tablet Take 10 mg by mouth daily.     losartan  (COZAAR ) 100 MG tablet TAKE 1 TABLET BY MOUTH EVERY DAY IN THE MORNING     metoprolol  succinate (TOPROL -XL) 100 MG 24 hr tablet Take 100 mg by mouth every morning.     MOUNJARO 7.5 MG/0.5ML Pen Inject 7.5 mg into the skin once a week.     ondansetron  (ZOFRAN ) 4 MG tablet Take 4 mg by mouth daily as needed for nausea or vomiting.     sertraline  (ZOLOFT ) 100 MG tablet Take 200 mg by mouth daily.  (Patient taking differently: Take 150 mg by mouth daily.)     spironolactone  (ALDACTONE ) 25 MG tablet Take 1 tablet (25 mg total) by mouth daily. 30 tablet 2   No current facility-administered medications for this encounter.    Atrial Fibrillation Management history:  Previous antiarrhythmic drugs: none Previous cardioversions: none Previous ablations: none Anticoagulation history: Eliquis   ROS- All systems are reviewed and negative except as per the HPI above.  Physical Exam: BP 124/68   Pulse 71   Ht 5' 4 (1.626 m)   Wt 100.7 kg   BMI 38.11 kg/m   GEN- The patient is well appearing, alert and oriented x 3 today.   Neck - no JVD or carotid bruit noted Lungs- Clear to ausculation bilaterally, normal work of breathing Heart- Regular rate and rhythm, no murmurs, rubs or gallops, PMI not laterally displaced Extremities- no clubbing, cyanosis, or edema Skin - no rash or ecchymosis noted   EKG today demonstrates   Vent. rate 71 BPM PR interval 140 ms QRS duration 76 ms QT/QTcB 390/423 ms P-R-T axes 13 71 42 Normal sinus rhythm Nonspecific ST abnormality Abnormal ECG When compared with ECG of 09-Oct-2023 19:46, Nonspecific T wave abnormality, improved in Anterolateral leads  Echo 09/25/2023:  1. Left ventricular ejection fraction, by estimation, is 60 to 65%. The  left ventricle has normal function. The left ventricle has no regional  wall motion abnormalities. Left ventricular diastolic parameters are  consistent with Grade I diastolic  dysfunction (impaired relaxation).   2. Right ventricular systolic function is normal. The right ventricular  size is normal.   3. The mitral valve is degenerative. Trivial mitral valve regurgitation.  No evidence of mitral stenosis.   4. The aortic valve is tricuspid. Aortic valve regurgitation is not  visualized. Aortic valve sclerosis is present, with no evidence of aortic  valve stenosis.   5. The inferior vena cava is normal in size with greater than 50%  respiratory variability, suggesting right atrial pressure of 3 mmHg.   ASSESSMENT & PLAN CHA2DS2-VASc Score = 4  The patient's score is based upon: CHF History: 0 HTN History: 1 Diabetes History: 1 Stroke History: 0 Vascular Disease History: 1 Age Score: 0 Gender Score: 1       ASSESSMENT AND PLAN: Paroxysmal Atrial Fibrillation (ICD10:  I48.0) The patient's CHA2DS2-VASc score is 4, indicating a 4.8% annual risk of stroke.    Patient is currently in NSR. We had a discussion about medication treatments and ablation in detail. We discussed potential options such as Multaq, Tikosyn, and amiodarone. We talked about the monitoring required for these medications, hospital admission for Tikosyn, and potential adverse effects. We also discussed pulse field ablation as an option in the form of intervention. After discussion, patient is hesitant to begin AAD therapy / scared of potential adverse  effects. She is interested in speaking further about ablation with EP. We discussed procedure and what to expect. Will refer to EP. Continue Toprol  100 mg daily. Continue diltiazem  prn.    PR 164 ms Qtc 431 ms   Secondary Hypercoagulable State (ICD10:  D68.69) The patient is at significant risk for stroke/thromboembolism based upon her CHA2DS2-VASc Score of 4.  Continue Apixaban  (Eliquis ).  Continue Eliquis  without interruption.    Refer to EP to discuss ablation.    Terra Pac, PA-C  [facility] [address] [place], [place] [address] [phone]

## 2023-10-22 NOTE — Addendum Note (Signed)
 Encounter addended by: Janel Nancy SAUNDERS, RN on: 10/22/2023 3:13 PM  Actions taken: Problem List modified, Visit diagnoses modified, Order list changed, Diagnosis association updated

## 2023-10-27 NOTE — Progress Notes (Unsigned)
 Cardiology Office Note   Date:  10/28/2023  ID:  Kristina Glover, DOB [birth year], MRN [id number] PCP: Alben Therisa MATSU, PA  [facility] Providers Cardiologist:  Lynwood Schilling, MD Electrophysiologist:  Donnice DELENA Primus, MD   History of Present Illness Kristina Glover is a 63 y.o. female with paroxysmal AF/AFL, DM2, COPD, HLD, CAD, HTN, tobacco use, and OSA who presents for AF management.  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  She lives at home by herself with her dog.  She has her daughter and her grandson nearby.  Originally she is from [place] .  She was recently evaluated in the emergency department for AF/RVR noted on 10/09/2023.  She had associated chest pressure and palpitations and was found to be in RVR in the 130s.  She started on diltiazem  drip and self converted to sinus rhythm.  She was discharged on Toprol -XL and Eliquis .   She was seen on 10/22/2023 by Fairy Heinrich and at that time was in NSR.  She recently was hospitalized 09/04 to 09/05 with AF/RVR.  [place] continues on Toprol -XL 100 mg daily and Dilt IR 30 mg as needed.  She is on Eliquis  for stroke risk reduction.  Today she presents to clinic and we reviewed the chronicity of her AF and associated symptoms.  ROS: palpitations, SOB, chest pressure when in AF/RVR, none current   Studies Reviewed  ECG review 10/22/23: NSR 71, PR 140, QRS 76, QT/c 390/423 10/09/23: AF/RVR 123, QRS 80, QT/c 350/501 10/14/23: NSR 64, PR 136, QRS 74, QT/c 398/410  08/01/23: AFL/VR 149, QRS 93, QT/c 262/413  TTE  Result date: 09/25/23 1. Left ventricular ejection fraction, by estimation, is 60 to 65%. The left ventricle has normal function. The left ventricle has no regional wall motion abnormalities.  Left ventricular diastolic parameters are consistent with Grade I diastolic dysfunction (impaired relaxation). 2. Right ventricular systolic function is normal. The right ventricular size is normal. 3. The mitral valve is degenerative. Trivial mitral valve regurgitation. No evidence of mitral stenosis. 4. The aortic valve is tricuspid. Aortic valve regurgitation is not visualized. Aortic valve sclerosis is present, with no evidence of aortic valve stenosis. 5. The inferior vena cava is normal in size with greater than 50% respiratory variability, suggesting right atrial pressure of 3 mmHg.  Risk Assessment/Calculations  CHA2DS2-VASc Score = 4  This indicates a 4.8% annual risk of stroke. The patient's score is based upon: CHF History: 0 HTN History: 1 Diabetes History: 1 Stroke History: 0 Vascular Disease History: 1 Age Score: 0 Gender Score: 1  Physical Exam VS:  BP 118/78   Pulse 69   Ht 5' 4 (1.626 m)   Wt 220 lb (99.8 kg)   SpO2 93%   BMI 37.76 kg/m   Wt Readings from Last 3 Encounters:  10/28/23 220 lb (99.8 kg)  10/22/23 222 lb (100.7 kg)  10/09/23 218 lb (98.9 kg)    GEN: Well nourished, well developed in no acute distress CARDIAC: RRR, no murmurs, rubs, gallops RESPIRATORY:  Clear to auscultation without rales, wheezing or rhonchi  ABDOMEN: Soft, non-tender, non-distended EXTREMITIES:  No edema; No deformity   ASSESSMENT AND PLAN [name] is a 63 y.o. female with paroxysmal AF/AFL, DM2, COPD, HLD,  CAD, HTN, tobacco use, and OSA who presents for AF/AFL management.  Paroxysmal AF Paroxysmal AFL  OSA Currently with paroxysmal AF with significant symptoms.  She has had 1 recent ED evaluation and would like to avoid additional antiarrhythmic medications.  She has one ECG with AF/RVR on 10/09/2023 however she also has another ECG on 08/01/23 with 2:1 AFL/RVR 149.  We reviewed the risk, benefits, and alternatives of catheter ablation and the success rates  associated with paroxysmal AF/AFL of >70%.  She understands that she may still have intermittent episodes of AF and that this is not a curable issue but is very manageable.  Discussed treatment options today for AF including antiarrhythmic drug therapy and ablation. Discussed risks, recovery and likelihood of success with each treatment strategy. Risk, benefits, and alternatives to EP study and ablation for afib were discussed. These risks include but are not limited to stroke, bleeding, vascular damage, tamponade, perforation, damage to the esophagus, lungs, phrenic nerve and other structures, worsening renal function, coronary vasospasm and death.  Discussed potential need for repeat ablation procedures and antiarrhythmic drugs after an initial ablation. The patient understands these risk and wishes to proceed.  We will therefore proceed with catheter ablation at the next available time.    Pre procedure Hold apixaban  the morning of procedure No CT scan prior  Carto/ICE/Farapulse/anesthesia Will need pre procedure abx with knee/neck implants   Dispo: f/u post procedure   A total of 65 minutes was spent preparing for the patient, reviewing history, performing exam, document encounter, coordinating care and counseling the patient. 40 minutes was spent with direct patient care.   Signed, Donnice DELENA Primus, MD

## 2023-10-28 ENCOUNTER — Encounter: Payer: Self-pay | Admitting: Student in an Organized Health Care Education/Training Program

## 2023-10-28 ENCOUNTER — Ambulatory Visit
Attending: Student in an Organized Health Care Education/Training Program | Admitting: Student in an Organized Health Care Education/Training Program

## 2023-10-28 VITALS — BP 118/78 | HR 69 | Ht 64.0 in | Wt 220.0 lb

## 2023-10-28 DIAGNOSIS — Z01812 Encounter for preprocedural laboratory examination: Secondary | ICD-10-CM | POA: Diagnosis not present

## 2023-10-28 DIAGNOSIS — I4891 Unspecified atrial fibrillation: Secondary | ICD-10-CM | POA: Diagnosis not present

## 2023-10-28 NOTE — Patient Instructions (Signed)
 Medication Instructions:  Your physician recommends that you continue on your current medications as directed. Please refer to the Current Medication list given to you today.  *If you need a refill on your cardiac medications before your next appointment, please call your pharmacy*  Lab Work: CBC and BMET - please have pre-procedure lab work completed on Friday 11/21/2023 . This can be done at ANY [facility] near you - no appointment required and this does not have to be fasting. If you have labs (blood work) drawn today and your tests are completely normal, you will receive your results only by: MyChart Message (if you have MyChart) OR A paper copy in the mail If you have any lab test that is abnormal or we need to change your treatment, we will call you to review the results.  Testing/Procedures:   Atrial Fibrillation Ablation - scheduled on Friday, 12/12/2023 We will be in contact closer to your ablation date with further instructions Your physician has recommended that you have an ablation. Catheter ablation is a medical procedure used to treat some cardiac arrhythmias (irregular heartbeats). During catheter ablation, a long, thin, flexible tube is put into a blood vessel in your groin (upper thigh), or neck. This tube is called an ablation catheter. It is then guided to your heart through the blood vessel. Radio frequency waves destroy small areas of heart tissue where abnormal heartbeats may cause an arrhythmia to start. Please see the instruction sheet given to you today.  Follow-Up: At [facility], you and your health needs are our priority.  As part of our continuing mission to provide you with exceptional heart care, our providers are all part of one team.  This team includes your primary Cardiologist (physician) and Advanced Practice Providers or APPs (Physician Assistants and Nurse Practitioners) who all work together to provide you with the care you need, when you need  it.  Your next appointment:   We will schedule follow up after your ablation  Provider:   Dr Donnice Primus   Cardiac Ablation Cardiac ablation is a procedure to destroy, or ablate, a small amount of heart tissue that is causing problems. The heart has many electrical connections. Sometimes, these connections are abnormal and can cause the heart to beat very fast or irregularly. Ablating the abnormal areas can improve the heart's rhythm or return it to normal. Ablation may be done for people who: Have irregular or rapid heartbeats (arrhythmias). Have Wolff-Parkinson-White syndrome. Have taken medicines for an arrhythmia that did not work or caused side effects. Have a high-risk heartbeat that may be life-threatening. Tell a health care provider about: Any allergies you have. All medicines you are taking, including vitamins, herbs, eye drops, creams, and over-the-counter medicines. Any problems you or family members have had with anesthesia. Any bleeding problems you have. Any surgeries you have had. Any medical conditions you have. Whether you are pregnant or may be pregnant. What are the risks? Your health care provider will talk with you about risks. These may include: Infection. Bruising and bleeding. Stroke or blood clots. Damage to nearby structures or organs. Allergic reaction to medicines or dyes. Needing a pacemaker if the heart gets damaged. A pacemaker is a device that helps the heart beat normally. Failure of the procedure. A repeat procedure may be needed. What happens before the procedure? Medicines Ask your health care provider about: Changing or stopping your regular medicines. These include any heart rhythm medicines, diabetes medicines, or blood thinners you take. Taking medicines  such as aspirin  and ibuprofen. These medicines can thin your blood. Do not take them unless your health care provider tells you to. Taking over-the-counter medicines, vitamins, herbs,  and supplements. General instructions Follow instructions from your health care provider about what you may eat and drink. If you will be going home right after the procedure, plan to have a responsible adult: Take you home from the hospital or clinic. You will not be allowed to drive. Care for you for the time you are told. Ask your health care provider what steps will be taken to prevent infection. What happens during the procedure?  An IV will be inserted into one of your veins. You may be given: A sedative. This helps you relax. Anesthesia. This will: Numb certain areas of your body. An incision will be made in your neck or your groin. A needle will be inserted through the incision and into a large vein in your neck or groin. The small, thin tube (catheter) will be inserted through the needle and moved to your heart. A type of X-ray (fluoroscopy) will be used to help guide the catheter and provide images of the heart on a monitor. Dye may be injected through the catheter to help your surgeon see the area of the heart that needs treatment. Electrical currents will be sent from the catheter to destroy heart tissue in certain areas. There are three types of energy that may be used to do this: Heat (radiofrequency energy). Laser energy. Extreme cold (cryoablation). When the tissue has been destroyed, the catheter will be removed. Pressure will be held on the insertion area to prevent bleeding. A bandage (dressing) will be placed over the insertion area. The procedure may vary among health care providers and hospitals. What happens after the procedure? Your blood pressure, heart rate and rhythm, breathing rate, and blood oxygen level will be monitored until you leave the hospital or clinic. Your insertion area will be checked for bleeding. You will need to lie still for a few hours. If your groin was used, you will need to keep your leg straight for a few hours after the catheter is  removed. This information is not intended to replace advice given to you by your health care provider. Make sure you discuss any questions you have with your health care provider. Document Revised: 07/10/2021 Document Reviewed: 07/10/2021 Elsevier Patient Education  2024 [employer].

## 2023-10-30 NOTE — Unmapped External Note (Signed)
 Mbr. was out to eat, asked to be called sometime tomorrow. Will call back tomorrow.

## 2023-11-01 ENCOUNTER — Other Ambulatory Visit: Payer: Self-pay

## 2023-11-01 ENCOUNTER — Emergency Department (HOSPITAL_COMMUNITY)
Admission: EM | Admit: 2023-11-01 | Discharge: 2023-11-01 | Disposition: A | Discharge status: Home / Self Care | Attending: Emergency Medicine | Admitting: Emergency Medicine

## 2023-11-01 ENCOUNTER — Telehealth: Admitting: Family Medicine

## 2023-11-01 ENCOUNTER — Encounter (HOSPITAL_COMMUNITY): Payer: Self-pay | Admitting: *Deleted

## 2023-11-01 DIAGNOSIS — I4891 Unspecified atrial fibrillation: Secondary | ICD-10-CM | POA: Insufficient documentation

## 2023-11-01 DIAGNOSIS — Z7901 Long term (current) use of anticoagulants: Secondary | ICD-10-CM | POA: Insufficient documentation

## 2023-11-01 DIAGNOSIS — R000 Tachycardia, unspecified: Secondary | ICD-10-CM | POA: Diagnosis present

## 2023-11-01 LAB — BASIC METABOLIC PANEL WITH GFR
Anion gap: 13 (ref 5–15)
BUN: 14 mg/dL (ref 8–23)
CO2: 20 mmol/L — ABNORMAL LOW (ref 22–32)
Calcium: 10.2 mg/dL (ref 8.9–10.3)
Chloride: 106 mmol/L (ref 98–111)
Creatinine, Ser: 0.84 mg/dL (ref 0.44–1.00)
GFR, Estimated: >60 mL/min (ref >60)
Glucose, Bld: 129 mg/dL — ABNORMAL HIGH (ref 70–99)
Potassium: 4 mmol/L (ref 3.5–5.1)
Sodium: 139 mmol/L (ref 135–145)

## 2023-11-01 LAB — CBC
HCT: 44 % (ref 36.0–46.0)
Hemoglobin: 14.8 g/dL (ref 12.0–15.0)
MCH: 29.8 pg (ref 26.0–34.0)
MCHC: 33.6 g/dL (ref 30.0–36.0)
MCV: 88.5 fL (ref 80.0–100.0)
Platelets: 273 K/uL (ref 150–400)
RBC: 4.97 MIL/uL (ref 3.87–5.11)
RDW: 13.2 % (ref 11.5–15.5)
WBC: 11.4 K/uL — ABNORMAL HIGH (ref 4.0–10.5)
nRBC: 0 % (ref 0.0–0.2)

## 2023-11-01 LAB — BRAIN NATRIURETIC PEPTIDE: B Natriuretic Peptide: 281.2 pg/mL — ABNORMAL HIGH (ref 0.0–100.0)

## 2023-11-01 LAB — TROPONIN I (HIGH SENSITIVITY): Troponin I (High Sensitivity): 8 ng/L (ref <18)

## 2023-11-01 MED ORDER — ACETAMINOPHEN 500 MG PO TABS
1000.0000 mg | ORAL_TABLET | Freq: Once | ORAL | Status: AC
Start: 2023-11-01 — End: 2023-11-01
  Administered 2023-11-01: 1000 mg via ORAL
  Filled 2023-11-01: qty 2

## 2023-11-01 NOTE — ED Provider Notes (Signed)
 [facility] EMERGENCY DEPARTMENT AT [facility] Provider Note   CSN: [id number] Arrival date & time: 11/01/23  1947     Patient presents with: Tachycardia   Kristina Glover is a 63 y.o. female with a history of paroxysmal A-fib, on Eliquis , metoprolol  100 mg XL daily, presented to ED with tachycardia.  Patient reports that she was eating a lunch today and had some palpitations and said her watch told her heart rate was elevated in A-fib.  She has been compliant with all of her medication including her Eliquis .  She takes metoprolol  daily.  She did try to take diltiazem  as a rescue medication but it made no difference with her heart rate.  She has seen cardiology as recently as earlier this week and is scheduled for an ablation early next month.  I reviewed external records and patient has also had an echocardiogram performed recently, with no thrombosis noted.   HPI     Prior to Admission medications   Medication Sig Start Date End Date Taking? Authorizing Provider  albuterol  (VENTOLIN  HFA) 108 (90 Base) MCG/ACT inhaler Inhale 2 puffs into the lungs as needed for wheezing or shortness of breath.    [provider]  apixaban  (ELIQUIS ) 5 MG TABS tablet Take 1 tablet (5 mg total) by mouth 2 (two) times daily. 09/01/23   Lavona Agent, MD  buPROPion  (WELLBUTRIN  XL) 150 MG 24 hr tablet Take 150 mg by mouth daily.    [provider]  Cholecalciferol (HM VITAMIN D3) 4000 units CAPS Take 4,000 Units by mouth daily. Patient taking differently: Take 8,000 Units by mouth daily.    [provider]  Cyanocobalamin (B-12 COMPLIANCE INJECTION) 1000 MCG/ML KIT Inject 1,000 mcg as directed every 30 (thirty) days.    [provider]  diltiazem  (CARDIZEM ) 30 MG tablet Take 1 tablet (30 mg total) by mouth daily as needed (for palpitations and HR>100/MIN.). 10/10/23   Akula, Vijaya, MD  loratadine  (CLARITIN ) 10 MG tablet Take 10 mg by mouth daily.    [provider]  losartan  (COZAAR ) 100 MG tablet TAKE 1 TABLET BY MOUTH EVERY DAY IN THE MORNING    [provider]  metoprolol  succinate (TOPROL -XL) 100 MG 24 hr tablet Take 100 mg by mouth every morning.    [provider]  MOUNJARO 7.5 MG/0.5ML Pen Inject 7.5 mg into the skin once a week. 11/15/22   [provider]  ondansetron  (ZOFRAN ) 4 MG tablet Take 4 mg by mouth daily as needed for nausea or vomiting. 05/27/23   [provider]  sertraline  (ZOLOFT ) 100 MG tablet Take 200 mg by mouth daily.  Patient taking differently: Take 150 mg by mouth daily.    [provider]  spironolactone  (ALDACTONE ) 25 MG tablet Take 1 tablet (25 mg total) by mouth daily. 10/11/23   Akula, Vijaya, MD    Allergies: Sulfamethoxazole, Gabapentin, Lisinopril, Methylphenidate, Adhesive [tape], Aripiprazole, Citalopram, Cyanocobalamin, Imodium [loperamide], Levofloxacin, Losartan  potassium-hctz, Prednisone, Semaglutide, Sulfamethoxazole-trimethoprim, Sulfonamide derivatives, Sulfur dioxide, Varenicline, Venlafaxine, and Diphenhydramine hcl    Review of Systems  Updated Vital Signs BP 134/70   Pulse 72   Temp 98 F (36.7 C)   Resp 18   Ht 5' 4 (1.626 m)   Wt 99.8 kg   SpO2 100%   BMI 37.77 kg/m   Physical Exam Constitutional:      General: She is not in acute distress. HENT:     Head: Normocephalic and atraumatic.  Eyes:  Conjunctiva/sclera: Conjunctivae normal.     Pupils: Pupils are equal, round, and reactive to light.  Cardiovascular:     Rate and Rhythm: Normal rate and regular rhythm.  Pulmonary:     Effort: Pulmonary effort is normal. No respiratory distress.  Abdominal:     General: There is no distension.     Tenderness: There is no abdominal tenderness.  Skin:    General: Skin is warm and dry.  Neurological:     General: No focal deficit present.     Mental Status: She is alert. Mental status is at baseline.  Psychiatric:        Mood and  Affect: Mood normal.        Behavior: Behavior normal.     (all labs ordered are listed, but only abnormal results are displayed) Labs Reviewed  BASIC METABOLIC PANEL WITH GFR - Abnormal; Notable for the following components:      Result Value   CO2 20 (*)    Glucose, Bld 129 (*)    All other components within normal limits  CBC - Abnormal; Notable for the following components:   WBC 11.4 (*)    All other components within normal limits  BRAIN NATRIURETIC PEPTIDE - Abnormal; Notable for the following components:   B Natriuretic Peptide 281.2 (*)    All other components within normal limits  TROPONIN I (HIGH SENSITIVITY)    EKG: EKG Interpretation Date/Time:  Saturday November 01 2023 19:58:31 EDT Ventricular Rate:  141 PR Interval:    QRS Duration:  72 QT Interval:  210 QTC Calculation: 321 R Axis:   65  Text Interpretation: Atrial flutter with 2:1 A-V conduction ST & T wave abnormality, consider anterolateral ischemia Abnormal ECG When compared with ECG of 22-Oct-2023 14:35, PREVIOUS ECG IS PRESENT Confirmed by Cottie Cough [phone]) on 11/01/2023 8:38:42 PM  Radiology: No results found.   Procedures   Medications Ordered in the ED  acetaminophen  (TYLENOL ) tablet 1,000 mg (1,000 mg Oral Given 11/01/23 2106)    Clinical Course as of 11/01/23 2253  Sat Nov 01, 2023  2102 While I was talking now to patient she appears to have spontaneously converted herself back to sinus rhythm [MT]  2200 She remains asymptomatic, no fluttering, in sinus rhythm, HR 80 bpm, BP 125 systolic [MT]    Clinical Course User Index [MT] Jazmyne Beauchesne, Cough PARAS, MD                                 Medical Decision Making Amount and/or Complexity of Data Reviewed ECG/medicine tests: ordered.  Risk OTC drugs.   Patient presented with tachycardia.  On initial presentation she appeared to be in A-fib with RVR per my review of telemetry and EKG.  However, during her conversation history, the  patient took a deep breath and and then converted herself back into a normal sinus rhythm.  We will continue to monitor for period of time in the ED and check her electrolytes as well.  I have a low suspicion for ACS, bacterial infection, pneumonia.  I reviewed her external medical records including echocardiogram report & cardiology report.  I reviewed her EKG today as well as her telemetry monitoring.  Initial EKG showed A-fib with RVR.  Subsequent EKG shows sinus rhythm.  Telemetry showed A-fib with RVR converting to sinus rhythm with heart rate in the 70s.  Patient was given Tylenol  which was requested for headache.  Patient's labs show no emergent findings     Final diagnoses:  Atrial fibrillation with RVR [facility])    ED Discharge Orders     None          Cottie Donnice PARAS, MD 11/01/23 [id number]

## 2023-11-01 NOTE — ED Triage Notes (Signed)
 The pt has been in and out of af since this am  even when the irregularity went away she has had tachycardia  hx of the same scheduled for an ablation in November  no pain sl indigestion she took diltizam approx1200n

## 2023-11-01 NOTE — Progress Notes (Signed)
 Virtual Visit Consent   Kristina Glover, you are scheduled for a virtual visit with a [facility] provider today. Just as with appointments in the office, your consent must be obtained to participate. Your consent will be active for this visit and any virtual visit you may have with one of our providers in the next 365 days. If you have a MyChart account, a copy of this consent can be sent to you electronically.  As this is a virtual visit, video technology does not allow for your provider to perform a traditional examination. This may limit your provider's ability to fully assess your condition. If your provider identifies any concerns that need to be evaluated in person or the need to arrange testing (such as labs, EKG, etc.), we will make arrangements to do so. Although advances in technology are sophisticated, we cannot ensure that it will always work on either your end or our end. If the connection with a video visit is poor, the visit may have to be switched to a telephone visit. With either a video or telephone visit, we are not always able to ensure that we have a secure connection.  By engaging in this virtual visit, you consent to the provision of healthcare and authorize for your insurance to be billed (if applicable) for the services provided during this visit. Depending on your insurance coverage, you may receive a charge related to this service.  I need to obtain your verbal consent now. Are you willing to proceed with your visit today? Rania Prothero Cordaro has provided verbal consent on 11/01/2023 for a virtual visit (video or telephone). Kristina Glover, [place]  Date: 11/01/2023 7:05 PM   Virtual Visit via Video Note   I, Kristina Glover, connected with  Kristina Glover  ([id number], [birth year]) on 11/01/23 at  7:00 PM EDT by a video-enabled telemedicine application and verified that I am speaking with the correct person using two identifiers.  Location: Patient: Virtual Visit Location Patient:  Home Provider: Virtual Visit Location Provider: Home Office   I discussed the limitations of evaluation and management by telemedicine and the availability of in person appointments. The patient expressed understanding and agreed to proceed.    History of Present Illness: CERENITI CURB is a 63 y.o. who identifies as a female who was assigned female at birth, and is being seen today for c/o recently diagnosed with A-fib at end of June.  She states she went into a-fib around 1130 am and her watch verified she was in Afib.  Pt states sometimes when she burps it will put her back in sinus rhythm.  Pt states she took her diltiazem  and that helped her get back into sinus rhythm for two hours now but heart rate is 130-140 currently. Pt denies chest pain, Pt does report fatigue but denies dizziness.   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   Seborrheic keratoses, inflamed 06/01/2013   Solar keratosis 05/14/2013   Anxiety and depression 12/22/2009   OSA on CPAP 12/22/2009   RESTLESS LEGS SYNDROME 12/22/2009   HYPERTENSION 12/22/2009   FIBROCYSTIC BREAST DISEASE 12/22/2009    Allergies:  Allergies  Allergen Reactions   Sulfamethoxazole Shortness Of Breath   Gabapentin Other (See Comments)     respiratory distress   Lisinopril Other (See Comments)    dizziness, tinnitus   Methylphenidate Other (See Comments)     heart racing, irregular heart rate  Ritalin   Adhesive [Tape] Other (See Comments)    Blisters   Aripiprazole Other (See Comments)     did not work   Citalopram Other (See Comments)     did not work    Cyanocobalamin Diarrhea   Imodium [Loperamide] Hives   Levofloxacin Other (See Comments)    Unknown   Losartan  Potassium-Hctz Other (See Comments)    off balance   Prednisone Swelling    She is not able to tolerate the oral prednisone- causes soreness to the touch - within her skin    Semaglutide Other (See Comments)     Depression   Sulfamethoxazole-Trimethoprim Hives   Sulfonamide Derivatives Other (See Comments)    Dehydration   Sulfur Dioxide Other (See Comments)    Unknown    Varenicline Other (See Comments)     depression   Venlafaxine Other (See Comments)     did not work   Diphenhydramine Hcl Palpitations    Heart race   Medications:  Current Outpatient Medications:    albuterol  (VENTOLIN  HFA) 108 (90 Base) MCG/ACT inhaler, Inhale 2 puffs into the lungs as needed for wheezing or shortness of breath., Disp: , Rfl:    apixaban  (ELIQUIS ) 5 MG TABS tablet, Take 1 tablet (5 mg total) by mouth 2 (two) times daily., Disp: 60 tablet, Rfl: 6   buPROPion  (WELLBUTRIN  XL) 150 MG 24 hr tablet, Take 150 mg by mouth daily., Disp: , Rfl:    Cholecalciferol (HM VITAMIN D3) 4000 units CAPS, Take 4,000 Units by mouth daily. (Patient taking differently: Take 8,000 Units by mouth daily.), Disp: , Rfl:    Cyanocobalamin (B-12 COMPLIANCE INJECTION) 1000 MCG/ML KIT, Inject 1,000 mcg as directed every 30 (thirty) days., Disp: , Rfl:    diltiazem  (CARDIZEM ) 30 MG tablet, Take 1 tablet (30 mg total) by mouth daily as needed (for palpitations and HR>100/MIN.)., Disp: 30 tablet, Rfl: 1   loratadine  (CLARITIN ) 10 MG tablet, Take 10 mg by mouth daily., Disp: , Rfl:    losartan  (COZAAR ) 100 MG tablet, TAKE 1 TABLET BY MOUTH EVERY DAY IN THE MORNING, Disp: , Rfl:    metoprolol  succinate (TOPROL -XL) 100 MG 24 hr tablet, Take 100 mg by mouth every morning., Disp: , Rfl:    MOUNJARO 7.5 MG/0.5ML Pen, Inject 7.5 mg into the skin once a week., Disp: , Rfl:    ondansetron  (ZOFRAN ) 4 MG tablet, Take 4 mg by mouth  daily as needed for nausea or vomiting., Disp: , Rfl:    sertraline  (ZOLOFT ) 100 MG tablet, Take 200 mg by mouth daily.  (Patient taking differently: Take 150 mg by mouth daily.), Disp: , Rfl:    spironolactone  (ALDACTONE ) 25 MG tablet, Take 1 tablet (25 mg total) by mouth daily., Disp: 30 tablet, Rfl: 2  Observations/Objective: Patient is well-developed, well-nourished in no acute distress.  Resting comfortably at home.  Head is normocephalic, atraumatic.  No labored breathing.  Speech is clear and coherent with logical content.  Patient is alert and oriented at baseline.    Assessment and Plan: 1. Atrial fibrillation, unspecified type (HCC) (Primary)  -Pt advised to proceed to emergency room for further evaluation of symptoms since her heart rate has not returned to  a normal state since she first went into A-fib -Pt verbalized understanding  Follow Up Instructions: I discussed the assessment and treatment plan with the patient. The patient was provided an opportunity to ask questions and all were answered. The patient agreed with the plan and demonstrated an understanding of the instructions.  A copy of instructions were sent to the patient via MyChart unless otherwise noted below.    The patient was advised to call back or seek an in-person evaluation if the symptoms worsen or if the condition fails to improve as anticipated.    Kristina Mater, PA-C

## 2023-11-01 NOTE — Patient Instructions (Signed)
 Kristina Glover, thank you for joining Roosvelt Mater, PA-C for today's virtual visit.  While this provider is not your primary care provider (PCP), if your PCP is located in our provider database this encounter information will be shared with them immediately following your visit.   A [facility] MyChart account gives you access to today's visit and all your visits, tests, and labs performed at [facility]  click here if you don't have a [facility] MyChart account or go to [email or web address]  Consent: (Patient) Kristina Glover provided verbal consent for this virtual visit at the beginning of the encounter.  Current Medications:  Current Outpatient Medications:    albuterol  (VENTOLIN  HFA) 108 (90 Base) MCG/ACT inhaler, Inhale 2 puffs into the lungs as needed for wheezing or shortness of breath., Disp: , Rfl:    apixaban  (ELIQUIS ) 5 MG TABS tablet, Take 1 tablet (5 mg total) by mouth 2 (two) times daily., Disp: 60 tablet, Rfl: 6   buPROPion  (WELLBUTRIN  XL) 150 MG 24 hr tablet, Take 150 mg by mouth daily., Disp: , Rfl:    Cholecalciferol (HM VITAMIN D3) 4000 units CAPS, Take 4,000 Units by mouth daily. (Patient taking differently: Take 8,000 Units by mouth daily.), Disp: , Rfl:    Cyanocobalamin (B-12 COMPLIANCE INJECTION) 1000 MCG/ML KIT, Inject 1,000 mcg as directed every 30 (thirty) days., Disp: , Rfl:    diltiazem  (CARDIZEM ) 30 MG tablet, Take 1 tablet (30 mg total) by mouth daily as needed (for palpitations and HR>100/MIN.)., Disp: 30 tablet, Rfl: 1   loratadine  (CLARITIN ) 10 MG tablet, Take 10 mg by mouth daily., Disp: , Rfl:    losartan  (COZAAR ) 100 MG tablet, TAKE 1 TABLET BY MOUTH EVERY DAY IN THE MORNING, Disp: , Rfl:    metoprolol  succinate (TOPROL -XL) 100 MG 24 hr tablet, Take 100 mg by mouth every morning., Disp: , Rfl:    MOUNJARO 7.5 MG/0.5ML Pen, Inject 7.5 mg into the skin once a week., Disp: , Rfl:    ondansetron  (ZOFRAN ) 4 MG tablet, Take 4 mg by mouth daily as  needed for nausea or vomiting., Disp: , Rfl:    sertraline  (ZOLOFT ) 100 MG tablet, Take 200 mg by mouth daily.  (Patient taking differently: Take 150 mg by mouth daily.), Disp: , Rfl:    spironolactone  (ALDACTONE ) 25 MG tablet, Take 1 tablet (25 mg total) by mouth daily., Disp: 30 tablet, Rfl: 2   Medications ordered in this encounter:  No orders of the defined types were placed in this encounter.    *If you need refills on other medications prior to your next appointment, please contact your pharmacy*  Follow-Up: Call back or seek an in-person evaluation if the symptoms worsen or if the condition fails to improve as anticipated.  [facility] [phone]  Other Instructions Atrial Fibrillation Atrial fibrillation (AFib) is a type of heartbeat that is irregular or fast. If you have AFib, your heart beats without any order. This makes it hard for your heart to pump blood in a normal way. AFib may come and go, or it may become a long-lasting problem. If AFib is not treated, it can put you at higher risk for stroke, heart failure, and other heart problems. What are the causes? AFib may be caused by diseases that damage the heart's electrical system. They include: High blood pressure. Heart failure. Heart valve diseases. Heart surgery. Diabetes. Thyroid  disease. Kidney disease. Lung diseases, such as pneumonia or COPD. Sleep apnea. Sometimes the cause is  not known. What increases the risk? You are more likely to develop AFib if: You are older. You exercise often and very hard. You have a family history of AFib. You are female. You are Caucasian. You are overweight. You smoke. You drink a lot of alcohol. What are the signs or symptoms? Common symptoms of this condition include: A feeling that your heart is beating very fast. Chest pain or discomfort. Feeling short of breath. Suddenly feeling light-headed or weak. Getting tired easily during  activity. Fainting. Sweating. In some cases, there are no symptoms. How is this treated? Medicines to: Prevent blood clots. Treat heart rate or heart rhythm problems. Using devices, such as a pacemaker, to correct heart rhythm problems. Doing surgery to remove the part of the heart that sends bad signals. Closing an area where clots can form in the heart (left atrial appendage). In some cases, your doctor will treat other underlying conditions. Follow these instructions at home: Medicines Take over-the-counter and prescription medicines only as told by your doctor. Do not take any new medicines without first talking to your doctor. If you are taking blood thinners: Talk with your doctor before taking aspirin  or NSAIDs, such as ibuprofen. Take your medicines as told. Take them at the same time each day. Do not do things that could hurt or bruise you. Be careful to avoid falls. Wear an alert bracelet or carry a card that says you take blood thinners. Lifestyle Do not smoke or use any products that contain nicotine or tobacco. If you need help quitting, ask your doctor. Eat heart-healthy foods. Talk with your doctor about the right eating plan for you. Exercise regularly as told by your doctor. Do not drink alcohol. Lose weight if you are overweight. General instructions If you have sleep apnea, treat it as told by your doctor. Do not use diet pills unless your doctor says they are safe for you. Diet pills may make heart problems worse. Keep all follow-up visits. Your doctor will check your heart rate and rhythm regularly. Contact a doctor if: You notice a change in the speed, rhythm, or strength of your heartbeat. You are taking a blood-thinning medicine and you get more bruising. You get tired more easily when you move or exercise. You have a sudden change in weight. Get help right away if:  You have pain in your chest. You have trouble breathing. You have side effects of blood  thinners, such as blood in your vomit, poop (stool), or pee (urine), or bleeding that cannot stop. You have any signs of a stroke. BE FAST is an easy way to remember the main warning signs: B - Balance. Dizziness, sudden trouble walking, or loss of balance. E - Eyes. Trouble seeing or a change in how you see. F - Face. Sudden weakness or loss of feeling in the face. The face or eyelid may droop on one side. A - Arms.Weakness or loss of feeling in an arm. This happens suddenly and usually on one side of the body. S - Speech. Sudden trouble speaking, slurred speech, or trouble understanding what people say. T - Time.Time to call emergency services. Write down what time symptoms started. You have other signs of a stroke, such as: A sudden, very bad headache with no known cause. Feeling like you may vomit (nausea). Vomiting. A seizure. These symptoms may be an emergency. Get help right away. Call 911. Do not wait to see if the symptoms will go away. Do not drive yourself to  the hospital. This information is not intended to replace advice given to you by your health care provider. Make sure you discuss any questions you have with your health care provider. Document Revised: 10/10/2021 Document Reviewed: 10/10/2021 Elsevier Patient Education  2024 Elsevier Inc.   If you have been instructed to have an in-person evaluation today at a local Urgent Care facility, please use the link below. It will take you to a list of all of our available [facility], including address, phone number and hours of operation. Please do not delay care.  [facility]  If you or a family member do not have a primary care provider, use the link below to schedule a visit and establish care. When you choose a [facility] primary care physician or advanced practice provider, you gain a long-term partner in health. Find a Primary Care Provider  Learn more about [facility] in-office and virtual  care options: [facility] - Get Care Now

## 2023-11-04 ENCOUNTER — Telehealth: Payer: Self-pay | Admitting: Cardiology

## 2023-11-04 ENCOUNTER — Encounter: Payer: Self-pay | Admitting: Physician Assistant

## 2023-11-04 ENCOUNTER — Ambulatory Visit: Attending: Physician Assistant | Admitting: Physician Assistant

## 2023-11-04 ENCOUNTER — Encounter: Payer: Self-pay | Admitting: Cardiology

## 2023-11-04 VITALS — BP 108/68 | HR 71 | Ht 64.0 in | Wt 221.2 lb

## 2023-11-04 DIAGNOSIS — G4733 Obstructive sleep apnea (adult) (pediatric): Secondary | ICD-10-CM | POA: Diagnosis not present

## 2023-11-04 DIAGNOSIS — D6869 Other thrombophilia: Secondary | ICD-10-CM

## 2023-11-04 DIAGNOSIS — I4892 Unspecified atrial flutter: Secondary | ICD-10-CM | POA: Diagnosis not present

## 2023-11-04 DIAGNOSIS — I4891 Unspecified atrial fibrillation: Secondary | ICD-10-CM | POA: Diagnosis not present

## 2023-11-04 DIAGNOSIS — I480 Paroxysmal atrial fibrillation: Secondary | ICD-10-CM

## 2023-11-04 MED ORDER — DILTIAZEM HCL 120 MG PO TABS: 120.0000 mg | ORAL_TABLET | Freq: Every day | ORAL | 1 refills | Status: DC | PRN

## 2023-11-04 MED ORDER — DILTIAZEM HCL 120 MG PO TABS: 120.0000 mg | ORAL_TABLET | Freq: Every day | ORAL | 1 refills | Status: DC

## 2023-11-04 MED ORDER — DILTIAZEM HCL 30 MG PO TABS: 30.0000 mg | ORAL_TABLET | Freq: Every day | ORAL | 1 refills | Status: DC | PRN

## 2023-11-04 MED ORDER — LOSARTAN POTASSIUM 100 MG PO TABS: 50.0000 mg | ORAL_TABLET | Freq: Every day | ORAL | 3 refills | Status: DC

## 2023-11-04 NOTE — Telephone Encounter (Signed)
 Patient called she is back in afib again since last night   She went to ER on 11/01/23 and  converted   She wanted to know if she need to take Eliquis  and prn Diltiazem  30 mg.  RN informed patient to always take Elqiuis  and she can take the prn Diltiazem   if she knew she was in afib. Up to  4 times in 24 hour ( 6 hrs apart)    Patient states she took a dose of Dilt.  Patient states she need to get a handle on this so she want lose her job.   No appt available in afib today . Appt schedule for 1:05 pm today  with T. [place], [place]

## 2023-11-04 NOTE — Progress Notes (Signed)
 Cardiology Office Note   Date:  11/04/2023  ID:  Kristina Glover, DOB 1960/02/18, MRN [id number] PCP: Kristina Therisa MATSU, PA  [facility] Providers Cardiologist:  Kristina Schilling, MD Electrophysiologist:  Kristina DELENA Primus, MD   History of Present Illness Kristina Glover is a 63 y.o. female with paroxysmal AF/AFL, DM2, COPD, HLD, CAD, HTN, tobacco use, and OSA who presents for AF management.   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  She lives at home by herself with her dog.  She has her daughter and her grandson nearby.  Originally she is from [place] .   She was recently evaluated in the emergency department for AF/RVR noted on 10/09/2023.  She had associated chest pressure and palpitations and was found to be in RVR in the 130s.  She started on diltiazem  drip and self converted to sinus rhythm.  She was discharged on Toprol -XL and Eliquis .    She was seen on 10/22/2023 by Kristina Glover and at that time was in NSR.  She recently was hospitalized 09/04 to 09/05 with AF/RVR.  [place] continues on Toprol -XL 100 mg daily and Dilt IR 30 mg as needed.  She is on Eliquis  for stroke risk reduction.   Today she presents to clinic and we reviewed the chronicity of her AF and associated symptoms.atrial fibrillation who presents with recurrent heart pain and AFib episodes.  She experiences recurrent heart pain and episodes of atrial fibrillation with increasing frequency. She is scheduled for an ablation in November. She uses diltiazem  as a rescue medication, which she took last night and this morning, allowing her to sleep, although she remained in AFib upon waking. She is currently on metoprolol  100 mg daily.  Potential triggers for her AFib episodes include indigestion,  stress, alcohol, caffeine, dehydration, and fluid imbalances. She experienced an AFib episode after eating peanut butter, though she is unsure if it was related. During AFib episodes, she experiences shortness of breath, which persists for a couple of hours after conversion, and her heart rate is irregular and fast, over 120 beats per minute. She also feels hot and experiences hot flashes during AFib.  Her past medical history includes coronary artery disease, noted as mild to moderate on a cardiac CTA from 2013. She is currently on losartan  for blood pressure management.  Reports no shortness of breath nor dyspnea on exertion. Reports no chest pain, pressure, or tightness. No edema, orthopnea, PND. Reports no palpitations.   Discussed the use of AI scribe software for clinical note transcription with the patient, who gave verbal consent to proceed.   ROS: pertinent ROS in HPI  Studies Reviewed     Echo 09/25/23  IMPRESSIONS     1. Left ventricular ejection fraction, by estimation, is 60 to 65%. The  left ventricle has normal function. The left ventricle has no regional  wall motion abnormalities. Left ventricular diastolic parameters are  consistent with Grade I diastolic  dysfunction (impaired relaxation).   2. Right ventricular systolic function is normal. The right ventricular  size is normal.   3. The mitral valve is degenerative. Trivial mitral valve regurgitation.  No evidence of mitral stenosis.   4. The aortic valve is tricuspid. Aortic valve regurgitation is not  visualized. Aortic  valve sclerosis is present, with no evidence of aortic  valve stenosis.   5. The inferior vena cava is normal in size with greater than 50%  respiratory variability, suggesting right atrial pressure of 3 mmHg.   FINDINGS   Left Ventricle: Left ventricular ejection fraction, by estimation, is 60  to 65%. The left ventricle has normal function. The left ventricle has no  regional wall motion  abnormalities. The left ventricular internal cavity  size was normal in size. There is   no left ventricular hypertrophy. Left ventricular diastolic parameters  are consistent with Grade I diastolic dysfunction (impaired relaxation).   Right Ventricle: The right ventricular size is normal. No increase in  right ventricular wall thickness. Right ventricular systolic function is  normal.   Left Atrium: Left atrial size was normal in size.   Right Atrium: Right atrial size was normal in size.   Pericardium: There is no evidence of pericardial effusion.   Mitral Valve: The mitral valve is degenerative in appearance. Mild mitral  annular calcification. Trivial mitral valve regurgitation. No evidence of  mitral valve stenosis.   Tricuspid Valve: The tricuspid valve is normal in structure. Tricuspid  valve regurgitation is not demonstrated. No evidence of tricuspid  stenosis.   Aortic Valve: The aortic valve is tricuspid. Aortic valve regurgitation is  not visualized. Aortic valve sclerosis is present, with no evidence of  aortic valve stenosis.   Pulmonic Valve: The pulmonic valve was normal in structure. Pulmonic valve  regurgitation is not visualized. No evidence of pulmonic stenosis.   Aorta: The aortic root is normal in size and structure.   Venous: The inferior vena cava is normal in size with greater than 50%  respiratory variability, suggesting right atrial pressure of 3 mmHg.   IAS/Shunts: No atrial level shunt detected by color flow Doppler.  Risk Assessment/Calculations  CHA2DS2-VASc Score = 4   This indicates a 4.8% annual risk of stroke. The patient's score is based upon: CHF History: 0 HTN History: 1 Diabetes History: 1 Stroke History: 0 Vascular Disease History: 1 Age Score: 0 Gender Score: 1      Physical Exam VS:  BP 108/68   Pulse 71   Ht 5' 4 (1.626 m)   Wt 221 lb 3.2 oz (100.3 kg)   SpO2 91%   BMI 37.97 kg/m        Wt Readings from Last 3  Encounters:  11/04/23 221 lb 3.2 oz (100.3 kg)  11/01/23 220 lb 0.3 oz (99.8 kg)  10/28/23 220 lb (99.8 kg)    GEN: Well nourished, well developed in no acute distress NECK: No JVD; No carotid bruits CARDIAC: RRR, no murmurs, rubs, gallops RESPIRATORY:  Clear to auscultation without rales, wheezing or rhonchi  ABDOMEN: Soft, non-tender, non-distended EXTREMITIES:  No edema; No deformity   ASSESSMENT AND PLAN  Atrial fibrillation and atrial flutter Recurrent atrial fibrillation with angina, dyspnea, and fatigue. Episodes increasing in frequency. Managed with metoprolol  and rescue diltiazem . Scheduled for ablation in November. Discussed daily diltiazem  to stabilize rhythm and reduce episodes. Explained hypotension risks with diltiazem  and losartan  adjustment. Advised avoiding intense activity during symptoms. - Initiate daily diltiazem  120 mg extended release. - Wean off losartan  to 50 mg, monitor blood pressure. - Continue metoprolol  100 mg daily. - Use rescue diltiazem  30 mg as needed for breakthrough episodes.  Peripheral arterial disease with claudication, evaluation in progress Leg pain with exertion, resolving with rest, suggests claudication. Differential includes peripheral arterial disease. - Order lower extremity  ultrasound to evaluate for arterial blockages or stenosis. - Consider referral to a peripheral arterial disease specialist if ultrasound indicates significant findings.  CAD -mild to moderate CCTA 2013 - No recent anginal symptoms, no ischemic workup indicated at this time     Dispo: She can follow-up 1 to 2 months post ablation  Signed, Orren LOISE Fabry, PA-C

## 2023-11-04 NOTE — Telephone Encounter (Signed)
 Patient c/o Palpitations:  STAT if patient reporting lightheadedness, shortness of breath, or chest pain  How long have you had palpitations/irregular HR/ Afib? Are you having the symptoms now?   Yes  Are you currently experiencing lightheadedness, SOB or CP?   SOB  Do you have a history of afib (atrial fibrillation) or irregular heart rhythm?   Yes  Have you checked your BP or HR? (document readings if available):   BP (not available)   HR 140  Are you experiencing any other symptoms? Headache  Patient is concerned she is in afib again and has not taken her Eliquis  today but has taken her Diltiazem .

## 2023-11-04 NOTE — Patient Instructions (Addendum)
 Medication Instructions:   START  TAKING:  LOSARTAN   50 MG  ONCE A DAY    START  TAKING:   DILTIAZEM  120 MG   ONCE A DAY   *If you need a refill on your cardiac medications before your next appointment, please call your pharmacy*  Lab Work: NONE ORDERED  TODAY   If you have labs (blood work) drawn today and your tests are completely normal, you will receive your results only by: MyChart Message (if you have MyChart) OR A paper copy in the mail If you have any lab test that is abnormal or we need to change your treatment, we will call you to review the results.   Testing/Procedures:   SOMEONE WILL CONTACT  YOU ABOUT PROCEDURE  RESCHEDULED IF POSSIBLE    Follow-Up: At [facility], you and your health needs are our priority.  As part of our continuing mission to provide you with exceptional heart care, our providers are all part of one team.  This team includes your primary Cardiologist (physician) and Advanced Practice Providers or APPs (Physician Assistants and Nurse Practitioners) who all work together to provide you with the care you need, when you need it.  Your next appointment:    2 -3  month(s)  POST ABLATION    Provider:    You will see one of the following Advanced Practice Providers on your designated Care Team:   Charlies Arthur, [place] Ozell Jodie Passey, PA-C Suzann Riddle, NP Daphne Barrack, NP Artist Pouch, PA-C     We recommend signing up for the patient portal called MyChart.  Sign up information is provided on this After Visit Summary.  MyChart is used to connect with patients for Virtual Visits (Telemedicine).  Patients are able to view lab/test results, encounter notes, upcoming appointments, etc.  Non-urgent messages can be sent to your provider as well.   To learn more about what you can do with MyChart, go to [email or web address].

## 2023-11-11 ENCOUNTER — Telehealth: Payer: Self-pay

## 2023-11-11 NOTE — Telephone Encounter (Signed)
Work up complete. 

## 2023-11-11 NOTE — Telephone Encounter (Signed)
-----   Message from Nurse Aldona R sent at 10/28/2023 12:00 PM EDT ----- Regarding: 12/12/23 Afib Almetta Important: list procedure date as first item in subject line, followed by procedure type (e.g., 10/17/23 AFib ablation)  Precert:  MD: Almetta Type of ablation: A-fib Diagnosis: Afib CPT code: A-fib (06343) Ablation scheduled (date/time): 11/7 at 930am  Procedure:  Added to calendar? Yes Orders entered? No, >30 days before procedure Letter complete? No, >30 days before procedure Scheduled with cath lab? Yes Any medications to hold? Yes (please list hold instructions): Monjaro Labs ordered (CBC, BMET, PT/INR if on warfarin): Yes Mapping system: CARTO (lab 4 or 6) CARTO/OPAL rep notified? Yes Cardiac CT needed? No Dye allergy? No Pre-meds ordered and instructions given? N/A Letter method: MyChart H&P: 9/23 Device: No  Follow-up:  Cassie/Angel, please schedule Routine.  Covering RN - please send this message to [employer], EP scheduler, EP Scheduling pool, EP [employer], and CT scheduler ([place] Lynch/Stephanie Mogg), if indicated.

## 2023-11-24 ENCOUNTER — Encounter (HOSPITAL_COMMUNITY): Payer: Self-pay

## 2023-11-24 ENCOUNTER — Telehealth (HOSPITAL_COMMUNITY): Payer: Self-pay

## 2023-11-24 NOTE — Telephone Encounter (Signed)
 Spoke with patient to complete pre-procedure call.     Health status review:  Any new medical conditions, recent signs of acute illness or been started on antibiotics? No Any recent hospitalizations or surgeries? No Any new medications started since pre-op visit? No  Follow all medication instructions prior to procedure or the procedure may be rescheduled:    Continue taking Eliquis  (Apixaban ) twice daily without missing any doses before procedure. Patient reports she missed 1 dose of Eliquis  a couple of days ago. Will initiate TEE protocol.  Essential chronic medications:  No medication should be continued, unless told otherwise.  HOLD: Tirzepatide (Mounjaro, Zepbound) for 1 week prior to the procedure. Last dose on or before Monday, October 27.  On the morning of your procedure DO NOT take any medication., including Eliquis  (Apixaban ).  Nothing to eat or drink after midnight prior to your procedure.  Pre-procedure testing scheduled: lab work by October 24.  Confirmed patient is scheduled for Atrial Fibrillation Ablation on Friday, November 7 with Dr. Dr. Almetta. Instructed patient to arrive at the Main Entrance A at [facility]: [address] [place], [place] [address] and check in at Admitting at 7:30 AM.  Plan to go home the same day, you will only stay overnight if medically necessary.. You MUST have a responsible adult to drive you home and MUST be with you the first 24 hours after you arrive home or your procedure could be cancelled.  Informed patient a nurse will call a day before the procedure to confirm arrival time and ensure instructions are followed.  Patient verbalized understanding to information provided and is agreeable to proceed with procedure.   Advised patient to contact RN Navigator at [phone], to inform of any new medications started after call or concerns prior to procedure.

## 2023-11-27 LAB — CBC
Hematocrit: 41.5 % (ref 34.0–46.6)
Hemoglobin: 14.1 g/dL (ref 11.1–15.9)
MCH: 29.7 pg (ref 26.6–33.0)
MCHC: 34 g/dL (ref 31.5–35.7)
MCV: 88 fL (ref 79–97)
Platelets: 272 10*3/uL (ref 150–450)
RBC: 4.74 x10E6/uL (ref 3.77–5.28)
RDW: 12.7 % (ref 11.7–15.4)
WBC: 8.3 10*3/uL (ref 3.4–10.8)

## 2023-11-27 LAB — BASIC METABOLIC PANEL WITH GFR
BUN/Creatinine Ratio: 17 (ref 12–28)
BUN: 14 mg/dL (ref 8–27)
CO2: 18 mmol/L — ABNORMAL LOW (ref 20–29)
Calcium: 9.8 mg/dL (ref 8.7–10.3)
Chloride: 105 mmol/L (ref 96–106)
Creatinine, Ser: 0.82 mg/dL (ref 0.57–1.00)
Glucose: 109 mg/dL — ABNORMAL HIGH (ref 70–99)
Potassium: 4.3 mmol/L (ref 3.5–5.2)
Sodium: 140 mmol/L (ref 134–144)
eGFR: 80 mL/min/{1.73_m2}

## 2023-12-11 NOTE — Pre-Procedure Instructions (Signed)
 Attempted to call patient regarding procedure instructions.  Left voicemail on the following items: Arrival time 0730 Nothing to eat or drink after midnight No meds AM of procedure Responsible person to drive you home and stay with you for 24 hrs  Have you missed any doses of anti-coagulant Eliquis - should be taken twice a day,  understand you have missed a dose in the last few weeks.  Don't take dose morning of procedure.  You should have been holding Mounjaro for last 7 days.

## 2023-12-12 ENCOUNTER — Ambulatory Visit (HOSPITAL_COMMUNITY): Payer: Self-pay | Admitting: Certified Registered"

## 2023-12-12 ENCOUNTER — Encounter (HOSPITAL_COMMUNITY)
Admission: RE | Disposition: A | Payer: Self-pay | Source: Home / Self Care | Attending: Student in an Organized Health Care Education/Training Program

## 2023-12-12 ENCOUNTER — Other Ambulatory Visit: Payer: Self-pay

## 2023-12-12 ENCOUNTER — Ambulatory Visit (HOSPITAL_COMMUNITY)
Admission: RE | Admit: 2023-12-12 | Discharge: 2023-12-12 | Disposition: A | Discharge status: Home / Self Care | Attending: Student in an Organized Health Care Education/Training Program | Admitting: Student in an Organized Health Care Education/Training Program

## 2023-12-12 DIAGNOSIS — Z79899 Other long term (current) drug therapy: Secondary | ICD-10-CM | POA: Diagnosis not present

## 2023-12-12 DIAGNOSIS — I480 Paroxysmal atrial fibrillation: Secondary | ICD-10-CM

## 2023-12-12 DIAGNOSIS — Z7901 Long term (current) use of anticoagulants: Secondary | ICD-10-CM | POA: Diagnosis not present

## 2023-12-12 DIAGNOSIS — G4733 Obstructive sleep apnea (adult) (pediatric): Secondary | ICD-10-CM | POA: Diagnosis not present

## 2023-12-12 DIAGNOSIS — Z720 Tobacco use: Secondary | ICD-10-CM | POA: Insufficient documentation

## 2023-12-12 DIAGNOSIS — Z87891 Personal history of nicotine dependence: Secondary | ICD-10-CM | POA: Diagnosis not present

## 2023-12-12 DIAGNOSIS — I483 Typical atrial flutter: Secondary | ICD-10-CM

## 2023-12-12 DIAGNOSIS — J449 Chronic obstructive pulmonary disease, unspecified: Secondary | ICD-10-CM | POA: Diagnosis not present

## 2023-12-12 DIAGNOSIS — I2510 Atherosclerotic heart disease of native coronary artery without angina pectoris: Secondary | ICD-10-CM | POA: Insufficient documentation

## 2023-12-12 DIAGNOSIS — I4892 Unspecified atrial flutter: Secondary | ICD-10-CM | POA: Diagnosis not present

## 2023-12-12 DIAGNOSIS — I4891 Unspecified atrial fibrillation: Secondary | ICD-10-CM

## 2023-12-12 DIAGNOSIS — Z6837 Body mass index (BMI) 37.0-37.9, adult: Secondary | ICD-10-CM | POA: Diagnosis not present

## 2023-12-12 DIAGNOSIS — E119 Type 2 diabetes mellitus without complications: Secondary | ICD-10-CM | POA: Diagnosis not present

## 2023-12-12 DIAGNOSIS — E785 Hyperlipidemia, unspecified: Secondary | ICD-10-CM | POA: Insufficient documentation

## 2023-12-12 DIAGNOSIS — I10 Essential (primary) hypertension: Secondary | ICD-10-CM

## 2023-12-12 DIAGNOSIS — E66813 Obesity, class 3: Secondary | ICD-10-CM | POA: Insufficient documentation

## 2023-12-12 HISTORY — PX: A-FLUTTER ABLATION: EP1230

## 2023-12-12 HISTORY — PX: ATRIAL FIBRILLATION ABLATION: EP1191

## 2023-12-12 LAB — GLUCOSE, CAPILLARY
Glucose-Capillary: 116 mg/dL — ABNORMAL HIGH (ref 70–99)
Glucose-Capillary: 140 mg/dL — ABNORMAL HIGH (ref 70–99)

## 2023-12-12 SURGERY — ATRIAL FIBRILLATION ABLATION
Anesthesia: General

## 2023-12-12 MED ORDER — ONDANSETRON HCL 4 MG/2ML IJ SOLN
INTRAMUSCULAR | Status: AC
  Filled 2023-12-12: qty 2

## 2023-12-12 MED ORDER — SODIUM CHLORIDE 0.9% FLUSH
3.0000 mL | INTRAVENOUS | Status: DC | PRN
Start: 2023-12-12 — End: 2023-12-12

## 2023-12-12 MED ORDER — DEXAMETHASONE SOD PHOSPHATE PF 10 MG/ML IJ SOLN
INTRAMUSCULAR | Status: DC | PRN
  Administered 2023-12-12: 10 mg via INTRAVENOUS

## 2023-12-12 MED ORDER — FENTANYL CITRATE (PF) 100 MCG/2ML IJ SOLN
INTRAMUSCULAR | Status: AC
  Filled 2023-12-12: qty 2

## 2023-12-12 MED ORDER — FENTANYL CITRATE (PF) 100 MCG/2ML IJ SOLN
25.0000 ug | INTRAMUSCULAR | Status: DC | PRN
  Administered 2023-12-12: 50 ug via INTRAVENOUS

## 2023-12-12 MED ORDER — ACETAMINOPHEN 325 MG PO TABS: 650.0000 mg | ORAL_TABLET | ORAL | Status: DC | PRN

## 2023-12-12 MED ORDER — MIDAZOLAM HCL (PF) 2 MG/2ML IJ SOLN
INTRAMUSCULAR | Status: DC | PRN
Start: 2023-12-12 — End: 2023-12-12
  Administered 2023-12-12: 2 mg via INTRAVENOUS

## 2023-12-12 MED ORDER — MIDAZOLAM HCL 2 MG/2ML IJ SOLN
INTRAMUSCULAR | Status: AC
  Filled 2023-12-12: qty 2

## 2023-12-12 MED ORDER — DEXMEDETOMIDINE HCL IN NACL 80 MCG/20ML IV SOLN
INTRAVENOUS | Status: AC
  Filled 2023-12-12: qty 20

## 2023-12-12 MED ORDER — APIXABAN 5 MG PO TABS
5.0000 mg | ORAL_TABLET | Freq: Two times a day (BID) | ORAL | Status: DC
  Administered 2023-12-12: 5 mg via ORAL
  Filled 2023-12-12: qty 1

## 2023-12-12 MED ORDER — PHENYLEPHRINE HCL-NACL 20-0.9 MG/250ML-% IV SOLN
INTRAVENOUS | Status: DC | PRN
  Administered 2023-12-12: 25 ug/min via INTRAVENOUS

## 2023-12-12 MED ORDER — HEPARIN SODIUM (PORCINE) 1000 UNIT/ML IJ SOLN
INTRAMUSCULAR | Status: DC | PRN
  Administered 2023-12-12: 5000 [IU] via INTRAVENOUS
  Administered 2023-12-12: 19000 [IU] via INTRAVENOUS

## 2023-12-12 MED ORDER — OXYCODONE HCL 5 MG/5ML PO SOLN
5.0000 mg | Freq: Once | ORAL | Status: DC | PRN
  Filled 2023-12-12: qty 5

## 2023-12-12 MED ORDER — SODIUM CHLORIDE 0.9 % IV SOLN: 250.0000 mL | INTRAVENOUS | Status: DC | PRN

## 2023-12-12 MED ORDER — ONDANSETRON HCL 4 MG/2ML IJ SOLN
4.0000 mg | Freq: Four times a day (QID) | INTRAMUSCULAR | Status: DC | PRN
  Administered 2023-12-12: 4 mg via INTRAVENOUS

## 2023-12-12 MED ORDER — CEFAZOLIN SODIUM-DEXTROSE 2-3 GM-%(50ML) IV SOLR
INTRAVENOUS | Status: DC | PRN
  Administered 2023-12-12: 2 g via INTRAVENOUS

## 2023-12-12 MED ORDER — SODIUM CHLORIDE 0.9 % IV SOLN: INTRAVENOUS | Status: DC

## 2023-12-12 MED ORDER — OXYCODONE HCL 5 MG PO TABS: 5.0000 mg | ORAL_TABLET | Freq: Once | ORAL | Status: DC | PRN

## 2023-12-12 MED ORDER — ROCURONIUM BROMIDE 10 MG/ML (PF) SYRINGE
PREFILLED_SYRINGE | INTRAVENOUS | Status: DC | PRN
Start: 2023-12-12 — End: 2023-12-12
  Administered 2023-12-12: 60 mg via INTRAVENOUS
  Administered 2023-12-12 (×2): 20 mg via INTRAVENOUS

## 2023-12-12 MED ORDER — PROPOFOL 500 MG/50ML IV EMUL
INTRAVENOUS | Status: DC | PRN
  Administered 2023-12-12: 150 ug/kg/min via INTRAVENOUS

## 2023-12-12 MED ORDER — PROTAMINE SULFATE 10 MG/ML IV SOLN
INTRAVENOUS | Status: DC | PRN
Start: 2023-12-12 — End: 2023-12-12
  Administered 2023-12-12: 20 mg via INTRAVENOUS
  Administered 2023-12-12: 40 mg via INTRAVENOUS

## 2023-12-12 MED ORDER — CEFAZOLIN SODIUM-DEXTROSE 2-4 GM/100ML-% IV SOLN
INTRAVENOUS | Status: DC
Start: 2023-12-12 — End: 2023-12-12
  Filled 2023-12-12: qty 100

## 2023-12-12 MED ORDER — SODIUM CHLORIDE 0.9% FLUSH: 3.0000 mL | Freq: Two times a day (BID) | INTRAVENOUS | Status: DC

## 2023-12-12 MED ORDER — ONDANSETRON HCL 4 MG/2ML IJ SOLN
INTRAMUSCULAR | Status: DC | PRN
  Administered 2023-12-12: 4 mg via INTRAVENOUS

## 2023-12-12 MED ORDER — LIDOCAINE 2% (20 MG/ML) 5 ML SYRINGE
INTRAMUSCULAR | Status: DC | PRN
  Administered 2023-12-12: 80 mg via INTRAVENOUS

## 2023-12-12 MED ORDER — PROPOFOL 10 MG/ML IV BOLUS
INTRAVENOUS | Status: DC | PRN
  Administered 2023-12-12 (×2): 50 mg via INTRAVENOUS
  Administered 2023-12-12: 100 mg via INTRAVENOUS

## 2023-12-12 MED ORDER — FENTANYL CITRATE (PF) 250 MCG/5ML IJ SOLN
INTRAMUSCULAR | Status: DC | PRN
  Administered 2023-12-12: 100 ug via INTRAVENOUS

## 2023-12-12 SURGICAL SUPPLY — 19 items
BAG SNAP BAND KOVER 36X36 (MISCELLANEOUS) IMPLANT
BLANKET WARM UNDERBOD FULL ACC (MISCELLANEOUS) ×1 IMPLANT
CATH DECANAV F CURVE (CATHETERS) IMPLANT
CATH GE 8FR SOUNDSTAR (CATHETERS) IMPLANT
CATH OCTARAY 2.0 F 3-3-3-3-3 (CATHETERS) IMPLANT
CATH SMTCH THERMOCOOL SF DF (CATHETERS) IMPLANT
CATH WEBSTER BI DIR CS D-F CRV (CATHETERS) IMPLANT
CATHETER VARIPULSE 8.5FR (CATHETERS) IMPLANT
CLOSURE PERCLOSE PROSTYLE (Vascular Products) IMPLANT
COVER SWIFTLINK CONNECTOR (BAG) ×1 IMPLANT
KIT VERSACROSS 8.5F 63 45D 180 (KITS) IMPLANT
PACK EP LF (CUSTOM PROCEDURE TRAY) ×1 IMPLANT
PAD DEFIB RADIO PHYSIO CONN (PAD) ×1 IMPLANT
PATCH CARTO3 (PAD) IMPLANT
SHEATH CARTO VIZIGO MED CURVE (SHEATH) IMPLANT
SHEATH PINNACLE 5F 10CM (SHEATH) IMPLANT
SHEATH PINNACLE 8F 10CM (SHEATH) IMPLANT
SHEATH PINNACLE 9F 10CM (SHEATH) IMPLANT
SHEATH PROBE COVER 6X72 (BAG) IMPLANT

## 2023-12-12 NOTE — Discharge Instructions (Signed)
 Cardiac Ablation, Care After  This sheet gives you information about how to care for yourself after your procedure. Your health care provider may also give you more specific instructions. If you have problems or questions, contact your health care provider. What can I expect after the procedure? After the procedure, it is common to have: Bruising around your puncture site. Tenderness around your puncture site. Skipped heartbeats. If you had an atrial fibrillation ablation, you may have atrial fibrillation during the first several months after your procedure.  Tiredness (fatigue).  Follow these instructions at home: Puncture site care  Follow instructions from your health care provider about how to take care of your puncture site. Make sure you: If present, leave stitches (sutures), skin glue, or adhesive strips in place. These skin closures may need to stay in place for up to 2 weeks. If adhesive strip edges start to loosen and curl up, you may trim the loose edges. Do not remove adhesive strips completely unless your health care provider tells you to do that. If a large square bandage is present, this may be removed 24 hours after surgery.  Check your puncture site every day for signs of infection. Check for: Redness, swelling, or pain. Fluid or blood. If your puncture site starts to bleed, lie down on your back, apply firm pressure to the area, and contact your health care provider. Warmth. Pus or a bad smell. A pea or marble sized lump/knot at the site is normal and can take up to three months to resolve.  Driving Do not drive for at least 4 days after your procedure or however long your health care provider recommends. (Do not resume driving if you have previously been instructed not to drive for other health reasons.) Do not drive or use heavy machinery while taking prescription pain medicine. Activity Avoid activities that take a lot of effort for at least 7 days after your  procedure. Do not lift anything that is heavier than 5 lb (4.5 kg) for one week.  No sexual activity for 1 week.  Return to your normal activities as told by your health care provider. Ask your health care provider what activities are safe for you. General instructions Take over-the-counter and prescription medicines only as told by your health care provider. Do not use any products that contain nicotine or tobacco, such as cigarettes and e-cigarettes. If you need help quitting, ask your health care provider. You may shower after 24 hours, but Do not take baths, swim, or use a hot tub for 1 week.  Do not drink alcohol for 24 hours after your procedure. Keep all follow-up visits as told by your health care provider. This is important. Contact a health care provider if: You have redness, mild swelling, or pain around your puncture site. You have fluid or blood coming from your puncture site that stops after applying firm pressure to the area. Your puncture site feels warm to the touch. You have pus or a bad smell coming from your puncture site. You have a fever. You have chest pain or discomfort that spreads to your neck, jaw, or arm. You have chest pain that is worse with lying on your back or taking a deep breath. You are sweating a lot. You feel nauseous. You have a fast or irregular heartbeat. You have shortness of breath. You are dizzy or light-headed and feel the need to lie down. You have pain or numbness in the arm or leg closest to your puncture site.  Get help right away if: Your puncture site suddenly swells. Your puncture site is bleeding and the bleeding does not stop after applying firm pressure to the area. These symptoms may represent a serious problem that is an emergency. Do not wait to see if the symptoms will go away. Get medical help right away. Call your local emergency services (911 in the [place] Do not drive yourself to the hospital. Summary After the procedure, it  is normal to have bruising and tenderness at the puncture site in your groin, neck, or forearm. Check your puncture site every day for signs of infection. Get help right away if your puncture site is bleeding and the bleeding does not stop after applying firm pressure to the area. This is a medical emergency. This information is not intended to replace advice given to you by your health care provider. Make sure you discuss any questions you have with your health care provider.  Femoral Site Care This sheet gives you information about how to care for yourself after your procedure. Your health care provider may also give you more specific instructions. If you have problems or questions, contact your health care provider. What can I expect after the procedure?  After the procedure, it is common to have: Bruising that usually fades within 1-2 weeks. Tenderness at the site. Follow these instructions at home: Wound care Follow instructions from your health care provider about how to take care of your insertion site. Make sure you: Wash your hands with soap and water before you change your bandage (dressing). If soap and water are not available, use hand sanitizer. Remove your dressing as told by your health care provider. In 24 hours Do not take baths, swim, or use a hot tub until your health care provider approves. You may shower 24-48 hours after the procedure or as told by your health care provider. Gently wash the site with plain soap and water. Pat the area dry with a clean towel. Do not rub the site. This may cause bleeding. Do not apply powder or lotion to the site. Keep the site clean and dry. Check your femoral site every day for signs of infection. Check for: Redness, swelling, or pain. Fluid or blood. Warmth. Pus or a bad smell. Activity For the first 2-3 days after your procedure, or as long as directed: Avoid climbing stairs as much as possible. Do not squat. Do not lift  anything that is heavier than 10 lb (4.5 kg), or the limit that you are told, until your health care provider says that it is safe. For 5 days Rest as directed. Avoid sitting for a long time without moving. Get up to take short walks every 1-2 hours. Do not drive for 24 hours if you were given a medicine to help you relax (sedative). General instructions Take over-the-counter and prescription medicines only as told by your health care provider. Keep all follow-up visits as told by your health care provider. This is important. Contact a health care provider if you have: A fever or chills. You have redness, swelling, or pain around your insertion site. Get help right away if: The catheter insertion area swells very fast. You pass out. You suddenly start to sweat or your skin gets clammy. The catheter insertion area is bleeding, and the bleeding does not stop when you hold steady pressure on the area. The area near or just beyond the catheter insertion site becomes pale, cool, tingly, or numb. These symptoms may represent a serious  problem that is an emergency. Do not wait to see if the symptoms will go away. Get medical help right away. Call your local emergency services (911 in the [place] Do not drive yourself to the hospital. Summary After the procedure, it is common to have bruising that usually fades within 1-2 weeks. Check your femoral site every day for signs of infection. Do not lift anything that is heavier than 10 lb (4.5 kg), or the limit that you are told, until your health care provider says that it is safe. This information is not intended to replace advice given to you by your health care provider. Make sure you discuss any questions you have with your health care provider. Document Revised: 02/03/2017 Document Reviewed: 02/03/2017 Elsevier Patient Education  2020 [employer].

## 2023-12-12 NOTE — H&P (Signed)
 Date:  12/12/23 ID:  Kristina Glover Free, DOB 02-15-1960, MRN [id number] PCP: Alben Therisa MATSU, PA  [facility] Providers Cardiologist:  Lynwood Schilling, MD Electrophysiologist:  Donnice Glover Primus, MD    History of Present Illness Kristina Glover is a 63 y.o. female with paroxysmal AF/AFL, DM2, COPD, HLD, CAD, HTN, tobacco use, and OSA who presents for AF management.   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  She lives at home by herself with her dog.  She has her daughter and her grandson nearby.  Originally she is from [place] .   She was recently evaluated in the emergency department for AF/RVR noted on 10/09/2023.  She had associated chest pressure and palpitations and was found to be in RVR in the 130s.  She started on diltiazem  drip and self converted to sinus rhythm.  She was discharged on Toprol -XL and Eliquis .    She was seen on 10/22/2023 by Fairy Heinrich and at that time was in NSR.  She recently was hospitalized 09/04 to 09/05 with AF/RVR.  [place] continues on Toprol -XL 100 mg daily and Dilt IR 30 mg as needed.  She is on Eliquis  for stroke risk reduction.   Today she presents to clinic and we reviewed the chronicity of her AF and associated symptoms.   ROS: palpitations, SOB, chest pressure when in AF/RVR, none current    Studies Reviewed   ECG review 10/22/23: NSR 71, PR 140, QRS 76, QT/c 390/423 10/09/23: AF/RVR 123, QRS 80, QT/c 350/501 10/14/23: NSR 64, PR 136, QRS 74, QT/c 398/410  08/01/23: AFL/VR 149, QRS 93, QT/c 262/413   TTE  Result date: 09/25/23 1. Left ventricular ejection fraction, by estimation, is 60 to 65%. The left ventricle has normal function. The left ventricle has no regional wall motion abnormalities. Left ventricular  diastolic parameters are consistent with Grade I diastolic dysfunction (impaired relaxation). 2. Right ventricular systolic function is normal. The right ventricular size is normal. 3. The mitral valve is degenerative. Trivial mitral valve regurgitation. No evidence of mitral stenosis. 4. The aortic valve is tricuspid. Aortic valve regurgitation is not visualized. Aortic valve sclerosis is present, with no evidence of aortic valve stenosis. 5. The inferior vena cava is normal in size with greater than 50% respiratory variability, suggesting right atrial pressure of 3 mmHg.   Risk Assessment/Calculations   CHA2DS2-VASc Score = 4  This indicates a 4.8% annual risk of stroke. The patient's score is based upon: CHF History: 0 HTN History: 1 Diabetes History: 1 Stroke History: 0 Vascular Disease History: 1 Age Score: 0 Gender Score: 1   Physical Exam VS:  BP 118/78   Pulse 69   Ht 5' 4 (1.626 m)   Wt 220 lb (99.8 kg)   SpO2 93%   BMI 37.76 kg/m       Wt Readings from Last 3 Encounters:  10/28/23 220 lb (99.8 kg)  10/22/23 222 lb (100.7 kg)  10/09/23 218 lb (98.9 kg)    GEN: Well nourished, well developed in no acute distress CARDIAC: RRR, no murmurs, rubs, gallops RESPIRATORY:  Clear to auscultation without rales, wheezing or rhonchi  ABDOMEN: Soft, non-tender, non-distended EXTREMITIES:  No edema; No deformity    ASSESSMENT AND PLAN Kristina Louischarles  Glover is a 63 y.o. female with paroxysmal AF/AFL, DM2, COPD, HLD, CAD, HTN, tobacco use, and OSA who presents for AF/AFL management.   Paroxysmal AF Paroxysmal AFL  OSA Currently with paroxysmal AF with significant symptoms.  She has had 1 recent ED evaluation and would like to avoid additional antiarrhythmic medications.  She has one ECG with AF/RVR on 10/09/2023 however she also has another ECG on 08/01/23 with 2:1 AFL/RVR 149.  We reviewed the risk, benefits, and alternatives of catheter ablation and the success rates associated with  paroxysmal AF/AFL of >70%.  She understands that she may still have intermittent episodes of AF and that this is not a curable issue but is very manageable.   Discussed treatment options today for AF including antiarrhythmic drug therapy and ablation. Discussed risks, recovery and likelihood of success with each treatment strategy. Risk, benefits, and alternatives to EP study and ablation for afib were discussed. These risks include but are not limited to stroke, bleeding, vascular damage, tamponade, perforation, damage to the esophagus, lungs, phrenic nerve and other structures, worsening renal function, coronary vasospasm and death.  Discussed potential need for repeat ablation procedures and antiarrhythmic drugs after an initial ablation. The patient understands these risk and wishes to proceed.  We will therefore proceed with catheter ablation at the next available time.     Pre procedure Hold apixaban  the morning of procedure No CT scan prior  Carto/ICE/Farapulse/anesthesia Will need pre procedure abx with knee/neck implants

## 2023-12-12 NOTE — Anesthesia Procedure Notes (Signed)
 Procedure Name: Intubation Date/Time: 12/12/2023 9:59 AM  Performed by: Delores Dus, CRNAPre-anesthesia Checklist: Patient identified, Emergency Drugs available, Suction available and Patient being monitored Patient Re-evaluated:Patient Re-evaluated prior to induction Oxygen Delivery Method: Circle system utilized Preoxygenation: Pre-oxygenation with 100% oxygen Induction Type: IV induction Ventilation: Mask ventilation without difficulty Laryngoscope Size: Glidescope and 3 Grade View: Grade I Tube type: Oral Tube size: 7.0 mm Number of attempts: 1 Airway Equipment and Method: Stylet and Oral airway Placement Confirmation: ETT inserted through vocal cords under direct vision, positive ETCO2 and breath sounds checked- equal and bilateral Secured at: 21 cm Tube secured with: Tape Dental Injury: Teeth and Oropharynx as per pre-operative assessment

## 2023-12-12 NOTE — Progress Notes (Signed)
 Discharge instructions reviewed with patient and sister Apolinar at the bedside. Denies questions or concerns. PT ambulated multiple times around the unit was able to void in th bathroom x2 without difficulty. No s/s of complications at the incision site. PT was escorted from the unit via wheel chair to personal vehicle driven by son.

## 2023-12-12 NOTE — Transfer of Care (Signed)
 Immediate Anesthesia Transfer of Care Note  Patient: Kristina Glover  Procedure(s) Performed: ATRIAL FIBRILLATION ABLATION  Patient Location: PACU  Anesthesia Type:General  Level of Consciousness: awake, alert , and oriented  Airway & Oxygen Therapy: Patient Spontanous Breathing and Patient connected to nasal cannula oxygen  Post-op Assessment: Report given to RN and Post -op Vital signs reviewed and stable  Post vital signs: Reviewed and stable  Last Vitals:  Vitals Value Taken Time  BP 133/86 12/12/23 12:00  Temp    Pulse 68 12/12/23 12:01  Resp 18 12/12/23 12:01  SpO2 97 % 12/12/23 12:01  Vitals shown include unfiled device data.  Last Pain:  Vitals:   12/12/23 0826  TempSrc:   PainSc: 0-No pain         Complications: No notable events documented.

## 2023-12-12 NOTE — Anesthesia Preprocedure Evaluation (Addendum)
 Anesthesia Evaluation  Patient identified by MRN, date of birth, ID band Patient awake    Reviewed: Allergy & Precautions, H&P , NPO status , Patient's Chart, lab work & pertinent test results  History of Anesthesia Complications (+) PONV and history of anesthetic complications  Airway Mallampati: III  TM Distance: >3 FB Neck ROM: full    Dental  (+) Dental Advisory Given, Caps, Teeth Intact All upper front are capped:   Pulmonary sleep apnea and Continuous Positive Airway Pressure Ventilation , former smoker   Pulmonary exam normal breath sounds clear to auscultation       Cardiovascular Exercise Tolerance: Good hypertension, Pt. on medications + CAD  Normal cardiovascular exam Rhythm:regular Rate:Normal     Neuro/Psych  Headaches PSYCHIATRIC DISORDERS Anxiety Depression       GI/Hepatic negative GI ROS, Neg liver ROS,,,  Endo/Other  negative endocrine ROS  Class 3 obesity  Renal/GU negative Renal ROS  negative genitourinary   Musculoskeletal  (+) Arthritis , Osteoarthritis,    Abdominal  (+) + obese  Peds  Hematology negative hematology ROS (+)   Anesthesia Other Findings   Reproductive/Obstetrics negative OB ROS                              Anesthesia Physical Anesthesia Plan  ASA: 3  Anesthesia Plan: General   Post-op Pain Management: Minimal or no pain anticipated   Induction: Intravenous  PONV Risk Score and Plan: 4 or greater and Ondansetron , Treatment may vary due to age or medical condition and TIVA  Airway Management Planned: Oral ETT  Additional Equipment: None  Intra-op Plan:   Post-operative Plan: Extubation in OR  Informed Consent: I have reviewed the patients History and Physical, chart, labs and discussed the procedure including the risks, benefits and alternatives for the proposed anesthesia with the patient or authorized representative who has indicated  his/her understanding and acceptance.     Dental Advisory Given  Plan Discussed with: CRNA and Anesthesiologist  Anesthesia Plan Comments:          Anesthesia Quick Evaluation

## 2023-12-12 NOTE — Progress Notes (Signed)
 Medicated for nausea.

## 2023-12-12 NOTE — Interval H&P Note (Signed)
 History and Physical Interval Note:  12/12/2023 9:48 AM  Kristina Glover  has presented today for surgery, with the diagnosis of AFib.  The various methods of treatment have been discussed with the patient and family. After consideration of risks, benefits and other options for treatment, the patient has consented to  Procedure(s): ATRIAL FIBRILLATION ABLATION (N/A) as a surgical intervention.  The patient's history has been reviewed, patient examined, no change in status, stable for surgery.  I have reviewed the patient's chart and labs.  Questions were answered to the patient's satisfaction.     Kristina Glover

## 2023-12-12 NOTE — Progress Notes (Signed)
Patient and sister was given discharge instructions. Both verbalized understanding. 

## 2023-12-13 NOTE — Anesthesia Postprocedure Evaluation (Signed)
 Anesthesia Post Note  Patient: Kristina Glover  Procedure(s) Performed: ATRIAL FIBRILLATION ABLATION A-FLUTTER ABLATION     Patient location during evaluation: PACU Anesthesia Type: General Level of consciousness: awake and alert Pain management: pain level controlled Vital Signs Assessment: post-procedure vital signs reviewed and stable Respiratory status: spontaneous breathing, nonlabored ventilation, respiratory function stable and patient connected to nasal cannula oxygen Cardiovascular status: blood pressure returned to baseline and stable Postop Assessment: no apparent nausea or vomiting Anesthetic complications: no   There were no known notable events for this encounter.  Last Vitals:  Vitals:   12/12/23 1430 12/12/23 1500  BP: (!) 150/70 126/60  Pulse: 74 81  Resp: 20 19  Temp:    SpO2: 94% 92%    Last Pain:  Vitals:   12/12/23 1250  TempSrc:   PainSc: 0-No pain   Pain Goal: Patients Stated Pain Goal: 0 (12/12/23 1250)                 Norberta Stobaugh

## 2023-12-13 NOTE — Op Note (Signed)
 Procedure:  Intracardiac catheter ablation AFIB including Transseptal Cath (CPT [id number]) Additional atrial ablation of discrete mechanism (CPT [phone])   Pre-Op Diagnosis: paroxysmal AF, typical AFL    Post-Op Diagnosis: same    Procedure Date:  12/12/23   Attending: Adina Primus, MD    Anesthesia: general anesthesia    Initial Intervals: NSR, PR 165, QRS 77, QT 352, RR 677   Procedure: The patient entered the EP lab in a fasting nonsedated state. The procedural time-out was achieved. The patient was placed under general anesthesia by Anesthesiology. Once the patient was draped and prepped in normal fashion with multiple layers of Hibiclens scrub in the bilateral groins, access to the veins was achieved under ultrasound guidance using the modified Seldinger technique. Following access both sites were pre closed with Perclose ProStyle suture mediated closure.    Access/sheath: - RFV: 8 Fr short sheath->8.5 Fr Sm curl Vizigo - RFV: 8 Fr short sheath  - LFV: 9 Fr short sheath   Catheters: - 8.5 Fr SoundStar ICE - Varipulse  PFA catheter - OctaRay 3-3-3-3-3 mapping catheter  - J&J ST SF D/F Theramcool RF ablation catheter  - Webster CS D/F decapolar catheter   Mapping (RA):  Next, a Webster CS decapolar catheter was advanced to the CS. A Thermacool ablation catheter was advanced to the CTI after CTI contours were obtained with ICE.    Ablation (RA):  Ablation (40 W) was applied from the TV annulus to the IVC along the CTI. CS pacing to lateral CTI was 160 ms. Pacing ablation lateral to the line and measuring to CSP was 158 ms. Bidirectional block confirmed.    Transseptal Access Systemic heparinization was given to maintain ACT's between >350. Transseptal puncture was performed with the VersaCross wire through the Vizigo sheath using ICE and fluoroscopy. The sheath was carefully aspirated and flushed.      Mapping: Next, a deflectable OctaRay was advanced into the left atrium  through the Vizigo sheath.  A 3-dimensional electroanatomic map was constructed of the left atrium and pulmonary veins using the Carto mapping system.     Ablation: The OctaRay was removed from the Vizigo sheath and the Carto Varipulse PFA catheter was advanced to the LSPV os. The PFA catheter was advanced to each of the 4 pulmonary veins and at least 4 PFA applications were applied per vein. Additional lesions were applied across both carinas and the RPV septum. Following initial ablation the Varipulse catheter was placed within each vein and exit block was observed. Repeat mapping with the OctaRay catheter confirmed first pass isolation in all 4 veins with entrance block as well.    Heparinization was then reversed with protamine. Catheters and sheaths were pulled and hemostasis obtained with Perclose ProStyle sutures. No complications were evident. Final ICE assessment without pericardial effusion. The patient was transported to the [facility] for recovery.   Final intervals: NSR, PR 157, QRS 75, QT 395, RR 950    Summary: NSR on arrival   Successful transseptal puncture x 1 with ICE guidance  3-D mapping of the RA, LA and PV's   Successful CTI ablation with RF  Successful PV isolation (WACA) using J&J Varipulse PFA    Recommendations: Bedrest x 2 hours  Anti-coagulation: Resume apixaban  5 mg bid once bedrest complete  Anti-platelet: none  Anti-arrhythmic: none Rate control: continue OP toprol  XL, d/c OP diltiazem   EP f/u to be scheduled    Donnice DELENA Primus, MD [facility]  Cardiac Electrophysiology

## 2023-12-14 ENCOUNTER — Encounter (HOSPITAL_COMMUNITY): Payer: Self-pay | Admitting: Student in an Organized Health Care Education/Training Program

## 2023-12-15 ENCOUNTER — Telehealth (HOSPITAL_COMMUNITY): Payer: Self-pay

## 2023-12-15 LAB — POCT ACTIVATED CLOTTING TIME
Activated Clotting Time: 342 s
Activated Clotting Time: 354 s

## 2023-12-15 MED FILL — Cefazolin Sodium-Dextrose IV Solution 2 GM/100ML-4%: INTRAVENOUS | Qty: 100 | Status: AC

## 2023-12-15 MED FILL — Dexmedetomidine HCl in NaCl 0.9% IV Soln 80 MCG/20ML: INTRAVENOUS | Qty: 20 | Status: AC

## 2023-12-15 MED FILL — Fentanyl Citrate Preservative Free (PF) Inj 100 MCG/2ML: INTRAMUSCULAR | Qty: 2 | Status: AC

## 2023-12-15 NOTE — Telephone Encounter (Signed)
 Spoke with patient to complete post procedure follow up call.  Patient reports no complications with groin sites.   Instructions reviewed with patient:  It is normal to have bruising, tenderness, mild swelling, and a pea or marble sized lump/knot at the groin site which can take up to three months to resolve.  Get help right away if you notice sudden swelling at the puncture site.  Check your puncture site every day for signs of infection: fever, redness, swelling, pus drainage, warmth, foul odor or excessive pain. If this occurs, please call [phone], to speak with the RN Navigator. Get help right away if your puncture site is bleeding and the bleeding does not stop after applying firm pressure to the area.  You may continue to have skipped beats/ atrial fibrillation during the first several months after your procedure.  It is very important not to miss any doses of your blood thinner Eliquis.    You will follow up with the Afib clinic 4 weeks after your procedure and follow up with the APP 3 months after your procedure.  Activity restrictions reviewed.  Patient verbalized understanding to all instructions provided.

## 2024-01-09 ENCOUNTER — Ambulatory Visit (HOSPITAL_COMMUNITY): Admitting: Internal Medicine

## 2024-01-12 ENCOUNTER — Ambulatory Visit (HOSPITAL_COMMUNITY)
Admission: RE | Admit: 2024-01-12 | Discharge: 2024-01-12 | Disposition: A | Source: Ambulatory Visit | Attending: Internal Medicine | Admitting: Internal Medicine

## 2024-01-12 VITALS — BP 158/80 | HR 71 | Ht 64.0 in | Wt 232.6 lb

## 2024-01-12 DIAGNOSIS — D6869 Other thrombophilia: Secondary | ICD-10-CM | POA: Diagnosis not present

## 2024-01-12 DIAGNOSIS — I480 Paroxysmal atrial fibrillation: Secondary | ICD-10-CM

## 2024-01-12 DIAGNOSIS — I4891 Unspecified atrial fibrillation: Secondary | ICD-10-CM | POA: Diagnosis not present

## 2024-01-12 NOTE — Progress Notes (Signed)
 Primary Care Physician: Alben Therisa MATSU, PA Primary Cardiologist: Lynwood Schilling, MD Electrophysiologist: Donnice DELENA Primus, MD     Referring Physician: Dr. Schilling Kristina Glover is a 63 y.o. female with a history of OSA, tobacco use, morbid obesity, T2DM, COPD, HLD, CAD, HTN, and atrial fibrillation who presents for consultation in the [facility]. ED visit on 08/01/23 for new onset Afib likely precipitated by not wearing CPAP due to temporary dental bridge and waiting on permanent bridge. Consulted in ED by Dr. Schilling - started on Eliquis . Converted to NSR while in ED. Patient is on Eliquis  5 mg BID for a CHADS2VASC score of 4.  On evaluation today, she is currently in NSR. She has noted intermittent palpitations. She resumed her CPAP treatment the night of the ED visit. No missed doses of Eliquis .  On follow up 10/22/23, patient is currently in NSR. Recent hospital admission 9/4-5 for Afib with RVR. Patient did have 3 episodes of Afib a couple of months ago when visiting [place]. Continues on Toprol  100 mg daily and given diltiazem  30 mg PRN. No missed doses of Eliquis .   On follow up 01/12/24, patient is currently in NSR. S/p Afib/flutter ablation on 12/12/23 by Dr. Primus. No episodes of Afib since ablation. No chest pain or SOB. Leg sites healed without issue. No missed doses of anticoagulant.  Patient went to [place] a few days ago and is currently dealing with an upper respiratory infection.  Today, she denies symptoms of orthopnea, PND, lower extremity edema, dizziness, presyncope, syncope, snoring, daytime somnolence, bleeding, or neurologic sequela. The patient is tolerating medications without difficulties and is otherwise without complaint today.    Atrial Fibrillation Risk Factors:  she does have symptoms or diagnosis of sleep apnea. she is compliant with CPAP therapy.  she has a BMI of Body mass index is 39.93 kg/m.SABRA Filed Weights    01/12/24 1144  Weight: 105.5 kg    Current Outpatient Medications  Medication Sig Dispense Refill   albuterol  (VENTOLIN  HFA) 108 (90 Base) MCG/ACT inhaler Inhale 2 puffs into the lungs every 6 (six) hours as needed for wheezing or shortness of breath.     apixaban  (ELIQUIS ) 5 MG TABS tablet Take 1 tablet (5 mg total) by mouth 2 (two) times daily. 60 tablet 6   buPROPion  (WELLBUTRIN  XL) 150 MG 24 hr tablet Take 150 mg by mouth daily.     Cholecalciferol (HM VITAMIN D3) 4000 units CAPS Take 4,000 Units by mouth daily.     Cyanocobalamin (B-12 COMPLIANCE INJECTION) 1000 MCG/ML KIT Inject 1,000 mcg as directed every 30 (thirty) days.     loratadine  (CLARITIN ) 10 MG tablet Take 10 mg by mouth daily.     losartan  (COZAAR ) 100 MG tablet Take 0.5 tablets (50 mg total) by mouth daily. 90 tablet 3   metoprolol  succinate (TOPROL -XL) 100 MG 24 hr tablet Take 100 mg by mouth every morning.     MOUNJARO 7.5 MG/0.5ML Pen Inject 7.5 mg into the skin once a week.     sertraline  (ZOLOFT ) 100 MG tablet Take 200 mg by mouth daily.  (Patient taking differently: Take 150 mg by mouth daily.)     spironolactone  (ALDACTONE ) 25 MG tablet Take 1 tablet (25 mg total) by mouth daily. 30 tablet 2   No current facility-administered medications for this encounter.    Atrial Fibrillation Management history:  Previous antiarrhythmic drugs: none Previous cardioversions: none Previous ablations: 12/12/2023 Anticoagulation  history: Eliquis    ROS- All systems are reviewed and negative except as per the HPI above.  Physical Exam: BP (!) 158/80   Pulse 71   Ht 5' 4 (1.626 m)   Wt 105.5 kg   BMI 39.93 kg/m   GEN- The patient is well appearing, alert and oriented x 3 today.   Neck - no JVD or carotid bruit noted Lungs- Clear to ausculation bilaterally, normal work of breathing Heart- Regular rate and rhythm, no murmurs, rubs or gallops, PMI not laterally displaced Extremities- no clubbing, cyanosis, or  edema Skin - no rash or ecchymosis noted   EKG today demonstrates  EKG Interpretation Date/Time:  Monday January 12 2024 11:52:56 EST Ventricular Rate:  71 PR Interval:  154 QRS Duration:  76 QT Interval:  394 QTC Calculation: 428 R Axis:   80  Text Interpretation: Normal sinus rhythm Nonspecific ST abnormality Abnormal ECG When compared with ECG of 12-Dec-2023 12:08, No significant change was found Confirmed by Terra Pac ([id number]) on 01/12/2024 11:55:29 AM    Echo 09/25/2023:  1. Left ventricular ejection fraction, by estimation, is 60 to 65%. The  left ventricle has normal function. The left ventricle has no regional  wall motion abnormalities. Left ventricular diastolic parameters are  consistent with Grade I diastolic  dysfunction (impaired relaxation).   2. Right ventricular systolic function is normal. The right ventricular  size is normal.   3. The mitral valve is degenerative. Trivial mitral valve regurgitation.  No evidence of mitral stenosis.   4. The aortic valve is tricuspid. Aortic valve regurgitation is not  visualized. Aortic valve sclerosis is present, with no evidence of aortic  valve stenosis.   5. The inferior vena cava is normal in size with greater than 50%  respiratory variability, suggesting right atrial pressure of 3 mmHg.   ASSESSMENT & PLAN CHA2DS2-VASc Score = 4  The patient's score is based upon: CHF History: 0 HTN History: 1 Diabetes History: 1 Stroke History: 0 Vascular Disease History: 1 Age Score: 0 Gender Score: 1       ASSESSMENT AND PLAN: Paroxysmal Atrial Fibrillation (ICD10:  I48.0) The patient's CHA2DS2-VASc score is 4, indicating a 4.8% annual risk of stroke.   S/p A-fib/flutter ablation on 12/12/2023 by Dr. Almetta.  Patient is currently in NSR.  Continue Toprol  100 mg daily.  Secondary Hypercoagulable State (ICD10:  D68.69) The patient is at significant risk for stroke/thromboembolism based upon her CHA2DS2-VASc Score of  4.  Continue Apixaban  (Eliquis ).  Continue Eliquis  without interruption.    Follow-up with the EP as scheduled.   Terra Pac, PA-C  [facility] [address] [place], [place] [address] [phone]

## 2024-03-11 ENCOUNTER — Ambulatory Visit: Admitting: Physician Assistant

## 2024-03-11 VITALS — BP 130/64 | HR 68 | Ht 64.0 in | Wt 229.0 lb

## 2024-03-11 DIAGNOSIS — I10 Essential (primary) hypertension: Secondary | ICD-10-CM | POA: Diagnosis not present

## 2024-03-11 DIAGNOSIS — D6869 Other thrombophilia: Secondary | ICD-10-CM | POA: Diagnosis not present

## 2024-03-11 DIAGNOSIS — I480 Paroxysmal atrial fibrillation: Secondary | ICD-10-CM | POA: Diagnosis not present

## 2024-03-11 MED ORDER — APIXABAN 5 MG PO TABS: 5.0000 mg | ORAL_TABLET | Freq: Two times a day (BID) | ORAL | 3 refills | Status: AC

## 2024-03-11 NOTE — Patient Instructions (Signed)
 Medication Instructions:   STOP TAKING : LOSARTAN   *If you need a refill on your cardiac medications before your next appointment, please call your pharmacy*    Lab Work: NONE ORDERED  TODAY    If you have labs (blood work) drawn today and your tests are completely normal, you will receive your results only by: MyChart Message (if you have MyChart) OR A paper copy in the mail If you have any lab test that is abnormal or we need to change your treatment, we will call you to review the results.    Testing/Procedures:NONE ORDERED  TODAY      Follow-Up: At [facility], you and your health needs are our priority.  As part of our continuing mission to provide you with exceptional heart care, our providers are all part of one team.  This team includes your primary Cardiologist (physician) and Advanced Practice Providers or APPs (Physician Assistants and Nurse Practitioners) who all work together to provide you with the care you need, when you need it.  Your next appointment:    6 month(s)  Provider:    Donnice Primus, MD or Charlies Arthur, PA-C    We recommend signing up for the patient portal called MyChart.  Sign up information is provided on this After Visit Summary.  MyChart is used to connect with patients for Virtual Visits (Telemedicine).  Patients are able to view lab/test results, encounter notes, upcoming appointments, etc.  Non-urgent messages can be sent to your provider as well.   To learn more about what you can do with MyChart, go to [email or web address].   Other Instructions
